# Patient Record
Sex: Female | Born: 1955 | Race: Black or African American | Hispanic: No | Marital: Married | Smoking: Never smoker
Health system: Southern US, Community
[De-identification: ages and names within clinical notes are randomized; demographics above are authoritative.]

## PROBLEM LIST (undated history)

## (undated) ENCOUNTER — Emergency Department (HOSPITAL_COMMUNITY): Admission: EM | Disposition: A | Discharge status: Home / Self Care

## (undated) DIAGNOSIS — N83209 Unspecified ovarian cyst, unspecified side: Secondary | ICD-10-CM

## (undated) DIAGNOSIS — E119 Type 2 diabetes mellitus without complications: Secondary | ICD-10-CM

## (undated) DIAGNOSIS — H3320 Serous retinal detachment, unspecified eye: Secondary | ICD-10-CM

## (undated) DIAGNOSIS — M5430 Sciatica, unspecified side: Secondary | ICD-10-CM

## (undated) DIAGNOSIS — H269 Unspecified cataract: Secondary | ICD-10-CM

## (undated) HISTORY — PX: OVARIAN CYST REMOVAL: SHX89

## (undated) HISTORY — PX: CATARACT EXTRACTION: SUR2

## (undated) HISTORY — PX: EYE SURGERY: SHX253

---

## 1998-06-28 ENCOUNTER — Emergency Department (HOSPITAL_COMMUNITY): Admission: EM | Admit: 1998-06-28 | Discharge: 1998-06-28 | Payer: Self-pay | Admitting: Emergency Medicine

## 1998-07-09 ENCOUNTER — Emergency Department (HOSPITAL_COMMUNITY): Admission: EM | Admit: 1998-07-09 | Discharge: 1998-07-09 | Payer: Self-pay | Admitting: Emergency Medicine

## 1999-09-11 ENCOUNTER — Emergency Department (HOSPITAL_COMMUNITY): Admission: EM | Admit: 1999-09-11 | Discharge: 1999-09-11 | Payer: Self-pay | Admitting: Emergency Medicine

## 2004-01-13 ENCOUNTER — Emergency Department (HOSPITAL_COMMUNITY): Admission: EM | Admit: 2004-01-13 | Discharge: 2004-01-14 | Payer: Self-pay | Admitting: Emergency Medicine

## 2006-01-29 ENCOUNTER — Emergency Department (HOSPITAL_COMMUNITY): Admission: EM | Admit: 2006-01-29 | Discharge: 2006-01-29 | Payer: Self-pay | Admitting: Emergency Medicine

## 2010-07-26 ENCOUNTER — Emergency Department (HOSPITAL_BASED_OUTPATIENT_CLINIC_OR_DEPARTMENT_OTHER)
Admission: EM | Admit: 2010-07-26 | Discharge: 2010-07-26 | Payer: Self-pay | Source: Home / Self Care | Admitting: Emergency Medicine

## 2010-08-06 ENCOUNTER — Emergency Department (HOSPITAL_BASED_OUTPATIENT_CLINIC_OR_DEPARTMENT_OTHER)
Admission: EM | Admit: 2010-08-06 | Discharge: 2010-08-06 | Payer: Self-pay | Source: Home / Self Care | Admitting: Emergency Medicine

## 2010-08-07 ENCOUNTER — Observation Stay (HOSPITAL_COMMUNITY)
Admission: EM | Admit: 2010-08-07 | Discharge: 2010-08-09 | Payer: Self-pay | Source: Home / Self Care | Attending: Internal Medicine | Admitting: Internal Medicine

## 2010-08-08 ENCOUNTER — Encounter (INDEPENDENT_AMBULATORY_CARE_PROVIDER_SITE_OTHER): Payer: Self-pay | Admitting: Internal Medicine

## 2010-08-15 ENCOUNTER — Emergency Department (HOSPITAL_BASED_OUTPATIENT_CLINIC_OR_DEPARTMENT_OTHER)
Admission: EM | Admit: 2010-08-15 | Discharge: 2010-08-15 | Disposition: A | Discharge status: Other Acute Inpatient Hospital | Payer: Self-pay | Source: Home / Self Care | Admitting: Emergency Medicine

## 2010-08-15 ENCOUNTER — Inpatient Hospital Stay (HOSPITAL_COMMUNITY)
Admission: AD | Admit: 2010-08-15 | Discharge: 2010-08-17 | Payer: Self-pay | Source: Home / Self Care | Attending: Internal Medicine | Admitting: Internal Medicine

## 2010-10-28 LAB — LIPID PANEL
Cholesterol: 142 mg/dL (ref 0–200)
LDL Cholesterol: 82 mg/dL (ref 0–99)

## 2010-10-28 LAB — POCT CARDIAC MARKERS
CKMB, poc: 2.7 ng/mL (ref 1.0–8.0)
Myoglobin, poc: 72.8 ng/mL (ref 12–200)
Troponin i, poc: <0.05 ng/mL (ref 0.00–0.09)

## 2010-10-28 LAB — GLUCOSE, CAPILLARY
Glucose-Capillary: 139 mg/dL — ABNORMAL HIGH (ref 70–99)
Glucose-Capillary: 143 mg/dL — ABNORMAL HIGH (ref 70–99)
Glucose-Capillary: 144 mg/dL — ABNORMAL HIGH (ref 70–99)
Glucose-Capillary: 204 mg/dL — ABNORMAL HIGH (ref 70–99)
Glucose-Capillary: 208 mg/dL — ABNORMAL HIGH (ref 70–99)

## 2010-10-28 LAB — BASIC METABOLIC PANEL
BUN: 10 mg/dL (ref 6–23)
BUN: 13 mg/dL (ref 6–23)
BUN: 9 mg/dL (ref 6–23)
CO2: 26 mEq/L (ref 19–32)
CO2: 28 mEq/L (ref 19–32)
CO2: 27 mEq/L (ref 19–32)
Calcium: 9.5 mg/dL (ref 8.4–10.5)
Chloride: 107 mEq/L (ref 96–112)
Chloride: 108 mEq/L (ref 96–112)
Chloride: 109 mEq/L (ref 96–112)
Chloride: 106 mEq/L (ref 96–112)
Creatinine, Ser: 0.63 mg/dL (ref 0.4–1.2)
Creatinine, Ser: 0.92 mg/dL (ref 0.4–1.2)
GFR calc Af Amer: >60 mL/min (ref >60)
GFR calc non Af Amer: >60 mL/min (ref >60)
GFR calc non Af Amer: >60 mL/min (ref >60)
Glucose, Bld: 143 mg/dL — ABNORMAL HIGH (ref 70–99)
Glucose, Bld: 154 mg/dL — ABNORMAL HIGH (ref 70–99)
Glucose, Bld: 166 mg/dL — ABNORMAL HIGH (ref 70–99)
Glucose, Bld: 208 mg/dL — ABNORMAL HIGH (ref 70–99)
Potassium: 3.6 mEq/L (ref 3.5–5.1)
Potassium: 4.1 mEq/L (ref 3.5–5.1)
Potassium: 4.5 mEq/L (ref 3.5–5.1)
Potassium: 4.6 mEq/L (ref 3.5–5.1)
Sodium: 145 mEq/L (ref 135–145)

## 2010-10-28 LAB — CBC
HCT: 34.6 % — ABNORMAL LOW (ref 36.0–46.0)
HCT: 35.5 % — ABNORMAL LOW (ref 36.0–46.0)
Hemoglobin: 11.6 g/dL — ABNORMAL LOW (ref 12.0–15.0)
MCH: 27 pg (ref 26.0–34.0)
MCHC: 32.7 g/dL (ref 30.0–36.0)
MCV: 87.6 fL (ref 78.0–100.0)
MCV: 82.8 fL (ref 78.0–100.0)
Platelets: 220 10*3/uL (ref 150–400)
RBC: 3.95 MIL/uL (ref 3.87–5.11)
WBC: 5.3 10*3/uL (ref 4.0–10.5)

## 2010-10-28 LAB — TSH: TSH: 0.507 u[IU]/mL (ref 0.350–4.500)

## 2010-10-28 LAB — DIFFERENTIAL
Basophils Relative: 0 % (ref 0–1)
Eosinophils Absolute: 0.2 10*3/uL (ref 0.0–0.7)
Eosinophils Relative: 4 % (ref 0–5)
Eosinophils Relative: 4 % (ref 0–5)
Lymphocytes Relative: 20 % (ref 12–46)
Lymphs Abs: 1.1 10*3/uL (ref 0.7–4.0)
Lymphs Abs: 1.1 10*3/uL (ref 0.7–4.0)
Monocytes Absolute: 0.5 10*3/uL (ref 0.1–1.0)
Monocytes Relative: 9 % (ref 3–12)
Neutro Abs: 3.7 10*3/uL (ref 1.7–7.7)
Neutrophils Relative %: 68 % (ref 43–77)

## 2010-10-28 LAB — D-DIMER, QUANTITATIVE: D-Dimer, Quant: 0.77 ug/mL-FEU — ABNORMAL HIGH (ref 0.00–0.48)

## 2010-10-28 LAB — TROPONIN I: Troponin I: 0.02 ng/mL (ref 0.00–0.06)

## 2010-10-28 LAB — MAGNESIUM: Magnesium: 2.1 mg/dL (ref 1.5–2.5)

## 2010-10-28 LAB — T4, FREE: Free T4: 1.07 ng/dL (ref 0.80–1.80)

## 2010-10-28 LAB — CK TOTAL AND CKMB (NOT AT ARMC): Total CK: 364 U/L — ABNORMAL HIGH (ref 7–177)

## 2010-10-28 LAB — HEMOGLOBIN A1C: Mean Plasma Glucose: 134 mg/dL — ABNORMAL HIGH (ref <117)

## 2010-10-28 LAB — BRAIN NATRIURETIC PEPTIDE: Pro B Natriuretic peptide (BNP): 40.4 pg/mL (ref 0.0–100.0)

## 2012-02-03 ENCOUNTER — Encounter (HOSPITAL_BASED_OUTPATIENT_CLINIC_OR_DEPARTMENT_OTHER): Payer: Self-pay | Admitting: *Deleted

## 2012-02-03 ENCOUNTER — Emergency Department (HOSPITAL_BASED_OUTPATIENT_CLINIC_OR_DEPARTMENT_OTHER)
Admission: EM | Admit: 2012-02-03 | Discharge: 2012-02-03 | Disposition: A | Discharge status: Home / Self Care | Payer: Self-pay | Attending: Emergency Medicine | Admitting: Emergency Medicine

## 2012-02-03 DIAGNOSIS — IMO0001 Reserved for inherently not codable concepts without codable children: Secondary | ICD-10-CM | POA: Insufficient documentation

## 2012-02-03 DIAGNOSIS — M5430 Sciatica, unspecified side: Secondary | ICD-10-CM

## 2012-02-03 DIAGNOSIS — M549 Dorsalgia, unspecified: Secondary | ICD-10-CM | POA: Insufficient documentation

## 2012-02-03 MED ORDER — ONDANSETRON HCL 4 MG/2ML IJ SOLN
4.0000 mg | Freq: Once | INTRAMUSCULAR | Status: AC
  Administered 2012-02-03: 4 mg via INTRAMUSCULAR
  Filled 2012-02-03: qty 2

## 2012-02-03 MED ORDER — IBUPROFEN 800 MG PO TABS: 800.0000 mg | ORAL_TABLET | Freq: Three times a day (TID) | ORAL | Status: AC

## 2012-02-03 MED ORDER — KETOROLAC TROMETHAMINE 60 MG/2ML IM SOLN
60.0000 mg | Freq: Once | INTRAMUSCULAR | Status: AC
  Administered 2012-02-03: 60 mg via INTRAMUSCULAR
  Filled 2012-02-03: qty 2

## 2012-02-03 MED ORDER — DIAZEPAM 5 MG PO TABS: 5.0000 mg | ORAL_TABLET | Freq: Two times a day (BID) | ORAL | Status: AC

## 2012-02-03 MED ORDER — HYDROMORPHONE HCL PF 2 MG/ML IJ SOLN
2.0000 mg | Freq: Once | INTRAMUSCULAR | Status: AC
  Administered 2012-02-03: 2 mg via INTRAMUSCULAR
  Filled 2012-02-03: qty 1

## 2012-02-03 NOTE — ED Provider Notes (Signed)
History     CSN: [id number]  Arrival date & time 02/03/12  1318   First MD Initiated Contact with Patient 02/03/12 1403      Chief Complaint  Patient presents with  . Back Pain    (Consider location/radiation/quality/duration/timing/severity/associated sxs/prior treatment) Patient is a 56 y.o. female presenting with back pain. The history is provided by the patient. No language interpreter was used.  Back Pain  This is a new problem. The current episode started yesterday. The problem occurs constantly. The problem has been gradually worsening. The pain is associated with no known injury. The pain radiates to the left thigh. The pain is at a severity of 6/10. The pain is moderate. The symptoms are aggravated by certain positions. The pain is worse during the day. She has tried nothing for the symptoms. The treatment provided moderate relief. Risk factors include obesity.  Pt recenttly diagnosed with sciatica.  Pt reports worse with walking,   Pt reports no relief with mobic  History reviewed. No pertinent past medical history.  Past Surgical History  Procedure Date  . Eye surgery   . Cesarean section     History reviewed. No pertinent family history.  History  Substance Use Topics  . Smoking status: Not on file  . Smokeless tobacco: Not on file  . Alcohol Use: No    OB History    Grav Para Term Preterm Abortions TAB SAB Ect Mult Living                  Review of Systems  Musculoskeletal: Positive for back pain.  All other systems reviewed and are negative.    Allergies  Review of patient's allergies indicates no known allergies.  Home Medications  No current outpatient prescriptions on file.  BP 168/91  Pulse 90  Temp 98.9 F (37.2 C) (Oral)  Ht 5\' 9"  (1.753 m)  Wt 350 lb (158.759 kg)  BMI 51.69 kg/m2  SpO2 100%  Physical Exam  Nursing note and vitals reviewed. Constitutional: She appears well-developed and well-nourished.  HENT:  Head:  Normocephalic.  Right Ear: External ear normal.  Left Ear: External ear normal.  Nose: Nose normal.  Mouth/Throat: Oropharynx is clear and moist.  Eyes: Conjunctivae are normal. Pupils are equal, round, and reactive to light.  Neck: Normal range of motion. Neck supple.  Cardiovascular: Normal rate, regular rhythm and normal heart sounds.   Pulmonary/Chest: Effort normal and breath sounds normal.  Abdominal: Soft. Bowel sounds are normal.  Musculoskeletal: She exhibits tenderness.       Tender left buttock down left leg  Neurological: She is alert.  Skin: Skin is warm.  Psychiatric: She has a normal mood and affect.    ED Course  Procedures (including critical care time)  Labs Reviewed - No data to display No results found.   No diagnosis found.    MDM  Pt given torodol, dilaudid and zofran.    I will treat with valium and ibuprofen.  Pt referred to Dr. Allena Napoleon, [place] 02/03/12 [phone]

## 2012-02-03 NOTE — Discharge Instructions (Signed)

## 2012-02-03 NOTE — ED Provider Notes (Signed)
Medical screening examination/treatment/procedure(s) were performed by non-physician practitioner and as supervising physician I was immediately available for consultation/collaboration.    Samin Milke L Olander Friedl, MD 02/03/12 2258 

## 2012-02-03 NOTE — ED Notes (Signed)
Pt c/o lower left  back pain which radiates into left hip and down left leg x 1 month. Recent dx sciatica

## 2012-10-20 ENCOUNTER — Emergency Department (HOSPITAL_BASED_OUTPATIENT_CLINIC_OR_DEPARTMENT_OTHER): Payer: Self-pay

## 2012-10-20 ENCOUNTER — Emergency Department (HOSPITAL_BASED_OUTPATIENT_CLINIC_OR_DEPARTMENT_OTHER)
Admission: EM | Admit: 2012-10-20 | Discharge: 2012-10-20 | Disposition: A | Discharge status: Home / Self Care | Payer: Self-pay | Attending: Emergency Medicine | Admitting: Emergency Medicine

## 2012-10-20 ENCOUNTER — Encounter (HOSPITAL_BASED_OUTPATIENT_CLINIC_OR_DEPARTMENT_OTHER): Payer: Self-pay

## 2012-10-20 DIAGNOSIS — R030 Elevated blood-pressure reading, without diagnosis of hypertension: Secondary | ICD-10-CM | POA: Insufficient documentation

## 2012-10-20 DIAGNOSIS — Z8742 Personal history of other diseases of the female genital tract: Secondary | ICD-10-CM | POA: Insufficient documentation

## 2012-10-20 DIAGNOSIS — Z8669 Personal history of other diseases of the nervous system and sense organs: Secondary | ICD-10-CM | POA: Insufficient documentation

## 2012-10-20 DIAGNOSIS — M79605 Pain in left leg: Secondary | ICD-10-CM

## 2012-10-20 DIAGNOSIS — M79609 Pain in unspecified limb: Secondary | ICD-10-CM | POA: Insufficient documentation

## 2012-10-20 DIAGNOSIS — G8929 Other chronic pain: Secondary | ICD-10-CM | POA: Insufficient documentation

## 2012-10-20 DIAGNOSIS — R609 Edema, unspecified: Secondary | ICD-10-CM | POA: Insufficient documentation

## 2012-10-20 HISTORY — DX: Serous retinal detachment, unspecified eye: H33.20

## 2012-10-20 HISTORY — DX: Unspecified ovarian cyst, unspecified side: N83.209

## 2012-10-20 HISTORY — DX: Unspecified cataract: H26.9

## 2012-10-20 MED ORDER — HYDROCODONE-ACETAMINOPHEN 5-325 MG PO TABS: 1.0000 | ORAL_TABLET | Freq: Four times a day (QID) | ORAL | Status: DC | PRN

## 2012-10-20 NOTE — ED Notes (Signed)
Pt states she did not get rx valium filled from last ED visit in June-requesting another rx of same-advised she would need to speak with EDP about request

## 2012-10-20 NOTE — ED Provider Notes (Signed)
History     CSN: [id number]  Arrival date & time 10/20/12  1106   First MD Initiated Contact with Patient 10/20/12 1145      Chief Complaint  Patient presents with  . Leg Pain    (Consider location/radiation/quality/duration/timing/severity/associated sxs/prior treatment) Patient is a 57 y.o. female presenting with leg pain. The history is provided by the patient.  Leg Pain Associated symptoms: no back pain and no fever    patient with left leg pain from knee to foot since October no direct injury. Worse in the last week. Patient not really taking any medicine occasionally takes some Advil for it. The pain at worst is 10 out of 10 currently 6/10 does not radiate above the knee. Not involved with any weakness or numbness in the feet.  Past Medical History  Diagnosis Date  . Ovarian cyst   . Retinal detachment   . Cataract     Past Surgical History  Procedure Laterality Date  . Eye surgery    . Cesarean section    . Ovarian cyst removal    . Cataract extraction      No family history on file.  History  Substance Use Topics  . Smoking status: Not on file  . Smokeless tobacco: Not on file  . Alcohol Use: No    OB History   Grav Para Term Preterm Abortions TAB SAB Ect Mult Living                  Review of Systems  Constitutional: Negative for fever.  HENT: Negative for congestion.   Respiratory: Negative for shortness of breath.   Cardiovascular: Negative for chest pain.  Gastrointestinal: Negative for nausea, vomiting and abdominal pain.  Genitourinary: Negative for dysuria.  Musculoskeletal: Negative for back pain.  Skin: Negative for rash.  Neurological: Negative for headaches.  Hematological: Does not bruise/bleed easily.    Allergies  Review of patient's allergies indicates no known allergies.  Home Medications   Current Outpatient Rx  Name  Route  Sig  Dispense  Refill  . ibuprofen (ADVIL,MOTRIN) 600 MG tablet   Oral   Take 600 mg by mouth  every 6 (six) hours as needed for pain.         Marland Kitchen HYDROcodone-acetaminophen (NORCO/VICODIN) 5-325 MG per tablet   Oral   Take 1-2 tablets by mouth every 6 (six) hours as needed for pain.   20 tablet   0     BP 176/75  Pulse 84  Temp(Src) 98.1 F (36.7 C) (Oral)  Resp 18  Ht 5\' 9"  (1.753 m)  Wt 350 lb (158.759 kg)  BMI 51.66 kg/m2  SpO2 100%  Physical Exam  Nursing note and vitals reviewed. Constitutional: She is oriented to person, place, and time. She appears well-developed and well-nourished.  HENT:  Head: Normocephalic and atraumatic.  Eyes: Conjunctivae are normal. Pupils are equal, round, and reactive to light.  Neck: Normal range of motion.  Pulmonary/Chest: Effort normal and breath sounds normal.  Abdominal: Soft. Bowel sounds are normal. There is no tenderness.  Musculoskeletal: Normal range of motion. She exhibits edema. She exhibits no tenderness.  Bilateral lower extremity swelling. Left not worse than right. Dorsalis pedis pulses 1+ in both feet. Good range of motion. No swelling of the knee or ankle no deformity. No erythema nontender to palpation.  Neurological: She is alert and oriented to person, place, and time. No cranial nerve deficit. She exhibits normal muscle tone. Coordination normal.  Skin: No  rash noted. No erythema.    ED Course  Procedures (including critical care time)  Labs Reviewed - No data to display US Venous Img Lower Unilateral Left  10/20/2012  *RADIOLOGY REPORT*  Clinical Data: Chronic left calf pain  LEFT LOWER EXTREMITY VENOUS DUPLEX ULTRASOUND  Technique:  Gray-scale sonography with graded compression, as well as color Doppler and duplex ultrasound, were performed to evaluate the deep venous system of the lower extremity from the level of the common femoral vein through the popliteal and proximal calf veins. Spectral Doppler was utilized to evaluate flow at rest and with distal augmentation maneuvers.  Comparison:  None.  Findings: The  visualized left lower extremity deep venous system appears patent.  Normal compressibility.  Patent color Doppler flow.  Satisfactory spectral Doppler with respiratory variation and response to augmentation.  The greater saphenous vein, where visualized, is patent and compressible.  Subcutaneous edema in the calf.  IMPRESSION: No deep venous thrombosis in the visualized left lower extremity.  Subcutaneous edema in the calf.   Original Report Authenticated By: Charline Bills, M.D.      1. Leg pain, left       MDM  Ultrasound study of the left leg shows no evidence of deep vein thrombosis. Clinically there is no evidence of cellulitis deformity or any concern for bony injury or sprain. Patient's pain is from the knee down. That pain has been present since October so it's chronic in nature. We'll treat with pain medication to patient the resource guide for referral. Here today put pressure was elevated but difficult to assess that long-term based on the fact that she was in pain today but if it continues she'll need the treatment for hypertension. Patient in no acute distress in ED nontoxic.        Shelda Jakes, MD 10/20/12 [phone]

## 2012-10-20 NOTE — ED Notes (Signed)
C/o pain to left lower leg since Oct-denies injury-states pain started after she had to sit up in chair post eye surgery

## 2012-11-24 ENCOUNTER — Emergency Department (HOSPITAL_COMMUNITY)
Admission: EM | Admit: 2012-11-24 | Discharge: 2012-11-24 | Disposition: A | Discharge status: Home / Self Care | Payer: Self-pay | Attending: Emergency Medicine | Admitting: Emergency Medicine

## 2012-11-24 ENCOUNTER — Encounter (HOSPITAL_COMMUNITY): Payer: Self-pay | Admitting: Emergency Medicine

## 2012-11-24 DIAGNOSIS — E669 Obesity, unspecified: Secondary | ICD-10-CM | POA: Insufficient documentation

## 2012-11-24 DIAGNOSIS — Z8669 Personal history of other diseases of the nervous system and sense organs: Secondary | ICD-10-CM | POA: Insufficient documentation

## 2012-11-24 DIAGNOSIS — Z8742 Personal history of other diseases of the female genital tract: Secondary | ICD-10-CM | POA: Insufficient documentation

## 2012-11-24 DIAGNOSIS — R609 Edema, unspecified: Secondary | ICD-10-CM | POA: Insufficient documentation

## 2012-11-24 DIAGNOSIS — M79609 Pain in unspecified limb: Secondary | ICD-10-CM | POA: Insufficient documentation

## 2012-11-24 DIAGNOSIS — M79605 Pain in left leg: Secondary | ICD-10-CM

## 2012-11-24 MED ORDER — TRAMADOL HCL 50 MG PO TABS: 50.0000 mg | ORAL_TABLET | Freq: Four times a day (QID) | ORAL | Status: DC | PRN

## 2012-11-24 NOTE — ED Notes (Signed)
Pt states for the past few weeks now her left leg has been hurting her behind the knee and leg. Denies any injury states that she has tried muscle creams and it has not got any better. Constant pain.

## 2012-11-24 NOTE — Progress Notes (Signed)
*  Preliminary Results* Left lower extremity venous duplex completed. Left lower extremity is negative for deep vein thrombosis. No evidence of left Baker's cyst. Preliminary results discussed with Riki Rusk, RN.  11/24/2012 2:12 PM Gertie Fey, RDMS, RDCS

## 2012-11-24 NOTE — ED Provider Notes (Signed)
History     CSN: [id number]  Arrival date & time 11/24/12  1121   First MD Initiated Contact with Patient 11/24/12 1230      Chief Complaint  Patient presents with  . Leg Pain    HPI  Patient presents with concern of ongoing left leg pain.  The pain is focally about the knee, posteriorly.  The pain is worse with motion and weightbearing.  She denies any fall, trauma. Onset was greater than one month ago, so the pain has become worse over the past few weeks, in spite of home medications use.  She is not clear what medication she has tried. She continues to ambulate, perform activities of daily living without significant impairment. She is not immobilized, does not smoke, does not take hormone supplement.  She states that the left leg is grossly larger than the right. She is been seen here once before for this pain.   Past Medical History  Diagnosis Date  . Ovarian cyst   . Retinal detachment   . Cataract     Past Surgical History  Procedure Laterality Date  . Eye surgery    . Cesarean section    . Ovarian cyst removal    . Cataract extraction      No family history on file.  History  Substance Use Topics  . Smoking status: Not on file  . Smokeless tobacco: Not on file  . Alcohol Use: No    OB History   Grav Para Term Preterm Abortions TAB SAB Ect Mult Living                  Review of Systems  Constitutional: Negative for fever.  HENT: Negative for congestion.   Respiratory: Negative for shortness of breath.   Cardiovascular: Negative for chest pain.  Gastrointestinal: Negative for nausea, vomiting and abdominal pain.  Genitourinary: Negative for dysuria.  Musculoskeletal: Negative for back pain.  Skin: Negative for rash.  Neurological: Negative for headaches.  Hematological: Does not bruise/bleed easily.    Allergies  Review of patient's allergies indicates no known allergies.  Home Medications   Current Outpatient Rx  Name  Route  Sig  Dispense   Refill  . traMADol (ULTRAM) 50 MG tablet   Oral   Take 1 tablet (50 mg total) by mouth every 6 (six) hours as needed for pain.   15 tablet   0     BP 190/84  Pulse 75  Temp(Src) 98 F (36.7 C) (Oral)  Resp 15  SpO2 98%  Physical Exam  Nursing note and vitals reviewed. Constitutional: She is oriented to person, place, and time. She appears well-developed and well-nourished.  HENT:  Head: Normocephalic and atraumatic.  Eyes: Conjunctivae are normal. Pupils are equal, round, and reactive to light.  Neck: Normal range of motion.  Pulmonary/Chest: Effort normal and breath sounds normal.  Abdominal: Soft. Bowel sounds are normal. There is no tenderness.  Musculoskeletal: Normal range of motion. She exhibits edema. She exhibits no tenderness.  Bilateral lower extremity swelling.  There is appreciable difference with left lower extremity larger than the right.  There is no tenderness to palpation throughout the knee, ankle, leg, the patient indicates the pain is deep in the posterior popliteal space  Neurological: She is alert and oriented to person, place, and time. No cranial nerve deficit. She exhibits normal muscle tone. Coordination normal.  Skin: No rash noted. No erythema.    ED Course  Procedures (including critical care time)  Labs Reviewed - No data to display No results found.   1. Leg pain, lateral, left     On repeat exam, when the patient was informed of the results, she appears in no distress.  MDM  The patient presents for second time in several months with ongoing left leg pain.  The patient is in no distress, the hypoxia, tachycardia, tachypnea, or other systemic complaints.  Given the patient's obesity, there is some suspicion for this etiology of her pain, though with the asymmetry of size, she had ultrasound to rule out DVT.  No evidence of PE.  The patient was discharged in stable condition with orthopedics followup.     Gerhard Munch, MD 11/24/12  1525

## 2013-04-21 ENCOUNTER — Emergency Department (HOSPITAL_BASED_OUTPATIENT_CLINIC_OR_DEPARTMENT_OTHER)
Admission: EM | Admit: 2013-04-21 | Discharge: 2013-04-21 | Disposition: A | Discharge status: Home / Self Care | Payer: Medicaid Other | Attending: Emergency Medicine | Admitting: Emergency Medicine

## 2013-04-21 ENCOUNTER — Encounter (HOSPITAL_BASED_OUTPATIENT_CLINIC_OR_DEPARTMENT_OTHER): Payer: Self-pay | Admitting: Emergency Medicine

## 2013-04-21 DIAGNOSIS — Z8742 Personal history of other diseases of the female genital tract: Secondary | ICD-10-CM | POA: Insufficient documentation

## 2013-04-21 DIAGNOSIS — Z8669 Personal history of other diseases of the nervous system and sense organs: Secondary | ICD-10-CM | POA: Insufficient documentation

## 2013-04-21 DIAGNOSIS — M79609 Pain in unspecified limb: Secondary | ICD-10-CM | POA: Insufficient documentation

## 2013-04-21 DIAGNOSIS — M5432 Sciatica, left side: Secondary | ICD-10-CM

## 2013-04-21 DIAGNOSIS — M5430 Sciatica, unspecified side: Secondary | ICD-10-CM | POA: Insufficient documentation

## 2013-04-21 DIAGNOSIS — E119 Type 2 diabetes mellitus without complications: Secondary | ICD-10-CM | POA: Insufficient documentation

## 2013-04-21 HISTORY — DX: Type 2 diabetes mellitus without complications: E11.9

## 2013-04-21 MED ORDER — ONDANSETRON HCL 4 MG/2ML IJ SOLN
4.0000 mg | Freq: Once | INTRAMUSCULAR | Status: AC
  Administered 2013-04-21: 4 mg via INTRAMUSCULAR
  Filled 2013-04-21: qty 2

## 2013-04-21 MED ORDER — HYDROMORPHONE HCL PF 1 MG/ML IJ SOLN
1.0000 mg | Freq: Once | INTRAMUSCULAR | Status: AC
  Administered 2013-04-21: 1 mg via INTRAMUSCULAR
  Filled 2013-04-21: qty 1

## 2013-04-21 MED ORDER — HYDROCODONE-ACETAMINOPHEN 5-325 MG PO TABS: 2.0000 | ORAL_TABLET | ORAL | Status: DC | PRN

## 2013-04-21 MED ORDER — DEXAMETHASONE SODIUM PHOSPHATE 10 MG/ML IJ SOLN
10.0000 mg | Freq: Once | INTRAMUSCULAR | Status: AC
  Administered 2013-04-21: 10 mg via INTRAMUSCULAR
  Filled 2013-04-21: qty 1

## 2013-04-21 NOTE — ED Provider Notes (Signed)
CSN: [id number]     Arrival date & time 04/21/13  1538 History   First MD Initiated Contact with Patient 04/21/13 1554     Chief Complaint  Patient presents with  . Leg Pain  . Back Pain   (Consider location/radiation/quality/duration/timing/severity/associated sxs/prior Treatment) HPI Comments: Patient presents with a one-month history of worsening pain to her left takes area that radiates down her left leg. She denies any numbness or weakness in the leg. She states it's worse when she sits down on her buttocks. She does have a history of sciatica and says it is still similar. She denies any injuries to her back or her leg. She's been taking over-the-counter medicines without relief. She denies abdominal pain. She denies any loss of bowel or bladder control.   Past Medical History  Diagnosis Date  . Ovarian cyst   . Retinal detachment   . Cataract   . Diabetes mellitus without complication    Past Surgical History  Procedure Laterality Date  . Eye surgery    . Cesarean section    . Ovarian cyst removal    . Cataract extraction     No family history on file. History  Substance Use Topics  . Smoking status: Never Smoker   . Smokeless tobacco: Not on file  . Alcohol Use: No   OB History   Grav Para Term Preterm Abortions TAB SAB Ect Mult Living                 Review of Systems  Constitutional: Negative for fever, chills, diaphoresis and fatigue.  HENT: Negative for congestion, rhinorrhea and sneezing.   Eyes: Negative.   Respiratory: Negative for cough, chest tightness and shortness of breath.   Cardiovascular: Negative for chest pain and leg swelling.  Gastrointestinal: Negative for nausea, vomiting, abdominal pain, diarrhea and blood in stool.  Genitourinary: Negative for frequency, hematuria, flank pain and difficulty urinating.  Musculoskeletal: Positive for back pain. Negative for arthralgias.  Skin: Negative for rash.  Neurological: Negative for dizziness, speech  difficulty, weakness, numbness and headaches.    Allergies  Review of patient's allergies indicates no known allergies.  Home Medications   Current Outpatient Rx  Name  Route  Sig  Dispense  Refill  . HYDROcodone-acetaminophen (NORCO/VICODIN) 5-325 MG per tablet   Oral   Take 2 tablets by mouth every 4 (four) hours as needed for pain.   15 tablet   0   . traMADol (ULTRAM) 50 MG tablet   Oral   Take 1 tablet (50 mg total) by mouth every 6 (six) hours as needed for pain.   15 tablet   0    BP 181/78  Pulse 88  Temp(Src) 98.8 F (37.1 C) (Oral)  Resp 20  Ht 5\' 9"  (1.753 m)  Wt 300 lb (136.079 kg)  BMI 44.28 kg/m2 Physical Exam  Constitutional: She is oriented to person, place, and time. She appears well-developed and well-nourished.  HENT:  Head: Normocephalic and atraumatic.  Eyes: Pupils are equal, round, and reactive to light.  Neck: Normal range of motion. Neck supple.  Cardiovascular: Normal rate, regular rhythm and normal heart sounds.   Pulmonary/Chest: Effort normal and breath sounds normal. No respiratory distress. She has no wheezes. She has no rales. She exhibits no tenderness.  Abdominal: Soft. Bowel sounds are normal. There is no tenderness. There is no rebound and no guarding.  Musculoskeletal: Normal range of motion. She exhibits no edema.  There is tenderness over the left  sciatic nerve. There's also tenderness in the left lower paraspinal muscles in the lumbar region. Negative straight leg raise bilaterally. She has normal sensation in the feet bilaterally. She has normal motor function in the legs. Pedal pulses are symmetric.  Lymphadenopathy:    She has no cervical adenopathy.  Neurological: She is alert and oriented to person, place, and time.  Skin: Skin is warm and dry. No rash noted.  Psychiatric: She has a normal mood and affect.    ED Course  Procedures (including critical care time) Labs Review Labs Reviewed - No data to display Imaging  Review No results found.  MDM   1. Sciatica, left    Patient symptoms consistent with sciatica. There's no neurologic deficits or signs of cauda equina. She was given a shot of Dilaudid, Decadron and Zofran here in the ED. She was also given a prescription for Vicodin to use at home. She was given referral to follow up with a [facility]. She will need to have her blood pressure rechecked by primary care physician    Rolan Bucco, MD 04/21/13 [phone]

## 2013-04-21 NOTE — ED Notes (Signed)
Left lower back pain radiating down left leg x1 month.  Reports hx of sciatica.

## 2013-04-21 NOTE — ED Notes (Addendum)
Left low back pain and left leg pain x1 month.  Hx of sciatica.  Pt also reports that she is diabetic and has not had any DM meds for a "couple months" since she can't afford it.

## 2013-07-11 ENCOUNTER — Ambulatory Visit: Payer: Medicaid Other | Admitting: Obstetrics

## 2013-09-01 ENCOUNTER — Emergency Department (HOSPITAL_BASED_OUTPATIENT_CLINIC_OR_DEPARTMENT_OTHER): Payer: Medicaid Other

## 2013-09-01 ENCOUNTER — Encounter (HOSPITAL_BASED_OUTPATIENT_CLINIC_OR_DEPARTMENT_OTHER): Payer: Self-pay | Admitting: Emergency Medicine

## 2013-09-01 ENCOUNTER — Emergency Department (HOSPITAL_BASED_OUTPATIENT_CLINIC_OR_DEPARTMENT_OTHER)
Admission: EM | Admit: 2013-09-01 | Discharge: 2013-09-01 | Disposition: A | Discharge status: Home / Self Care | Payer: Medicaid Other | Attending: Emergency Medicine | Admitting: Emergency Medicine

## 2013-09-01 ENCOUNTER — Telehealth: Payer: Self-pay | Admitting: General Practice

## 2013-09-01 DIAGNOSIS — E119 Type 2 diabetes mellitus without complications: Secondary | ICD-10-CM | POA: Insufficient documentation

## 2013-09-01 DIAGNOSIS — Z8742 Personal history of other diseases of the female genital tract: Secondary | ICD-10-CM | POA: Insufficient documentation

## 2013-09-01 DIAGNOSIS — R05 Cough: Secondary | ICD-10-CM

## 2013-09-01 DIAGNOSIS — Z8669 Personal history of other diseases of the nervous system and sense organs: Secondary | ICD-10-CM | POA: Insufficient documentation

## 2013-09-01 DIAGNOSIS — N390 Urinary tract infection, site not specified: Secondary | ICD-10-CM | POA: Insufficient documentation

## 2013-09-01 DIAGNOSIS — Z79899 Other long term (current) drug therapy: Secondary | ICD-10-CM | POA: Insufficient documentation

## 2013-09-01 DIAGNOSIS — R059 Cough, unspecified: Secondary | ICD-10-CM | POA: Insufficient documentation

## 2013-09-01 LAB — URINALYSIS, ROUTINE W REFLEX MICROSCOPIC
Bilirubin Urine: NEGATIVE
GLUCOSE, UA: NEGATIVE mg/dL
Hgb urine dipstick: NEGATIVE
KETONES UR: NEGATIVE mg/dL
Nitrite: NEGATIVE
PH: 7 (ref 5.0–8.0)
Protein, ur: NEGATIVE mg/dL
Specific Gravity, Urine: 1.018 (ref 1.005–1.030)
Urobilinogen, UA: 1 mg/dL (ref 0.0–1.0)

## 2013-09-01 LAB — URINE MICROSCOPIC-ADD ON

## 2013-09-01 MED ORDER — CEPHALEXIN 500 MG PO CAPS: 500.0000 mg | ORAL_CAPSULE | Freq: Four times a day (QID) | ORAL | Status: DC

## 2013-09-01 NOTE — ED Notes (Signed)
Pt reports a productive cough x 1 month and urinary frequency x 1 week unrelieved after taking OTC medications.

## 2013-09-01 NOTE — Telephone Encounter (Signed)
Returning pt's call, not able to leave message.

## 2013-09-01 NOTE — Discharge Instructions (Signed)
Urinary Tract Infection Urinary tract infections (UTIs) can develop anywhere along your urinary tract. Your urinary tract is your body's drainage system for removing wastes and extra water. Your urinary tract includes two kidneys, two ureters, a bladder, and a urethra. Your kidneys are a pair of bean-shaped organs. Each kidney is about the size of your fist. They are located below your ribs, one on each side of your spine. CAUSES Infections are caused by microbes, which are microscopic organisms, including fungi, viruses, and bacteria. These organisms are so small that they can only be seen through a microscope. Bacteria are the microbes that most commonly cause UTIs. SYMPTOMS  Symptoms of UTIs may vary by age and gender of the patient and by the location of the infection. Symptoms in young women typically include a frequent and intense urge to urinate and a painful, burning feeling in the bladder or urethra during urination. Older women and men are more likely to be tired, shaky, and weak and have muscle aches and abdominal pain. A fever may mean the infection is in your kidneys. Other symptoms of a kidney infection include pain in your back or sides below the ribs, nausea, and vomiting. DIAGNOSIS To diagnose a UTI, your caregiver will ask you about your symptoms. Your caregiver also will ask to provide a urine sample. The urine sample will be tested for bacteria and white blood cells. White blood cells are made by your body to help fight infection. TREATMENT  Typically, UTIs can be treated with medication. Because most UTIs are caused by a bacterial infection, they usually can be treated with the use of antibiotics. The choice of antibiotic and length of treatment depend on your symptoms and the type of bacteria causing your infection. HOME CARE INSTRUCTIONS  If you were prescribed antibiotics, take them exactly as your caregiver instructs you. Finish the medication even if you feel better after you  have only taken some of the medication.  Drink enough water and fluids to keep your urine clear or pale yellow.  Avoid caffeine, tea, and carbonated beverages. They tend to irritate your bladder.  Empty your bladder often. Avoid holding urine for long periods of time.  Empty your bladder before and after sexual intercourse.  After a bowel movement, women should cleanse from front to back. Use each tissue only once. SEEK MEDICAL CARE IF:   You have back pain.  You develop a fever.  Your symptoms do not begin to resolve within 3 days. SEEK IMMEDIATE MEDICAL CARE IF:   You have severe back pain or lower abdominal pain.  You develop chills.  You have nausea or vomiting.  You have continued burning or discomfort with urination. MAKE SURE YOU:   Understand these instructions.  Will watch your condition.  Will get help right away if you are not doing well or get worse. Document Released: 05/14/2005 Document Revised: 02/03/2012 Document Reviewed: 09/12/2011 [facility] Patient Information 2014 [place].  Cough, Adult  A cough is a reflex that helps clear your throat and airways. It can help heal the body or may be a reaction to an irritated airway. A cough may only last 2 or 3 weeks (acute) or may last more than 8 weeks (chronic).  CAUSES Acute cough:  Viral or bacterial infections. Chronic cough:  Infections.  Allergies.  Asthma.  Post-nasal drip.  Smoking.  Heartburn or acid reflux.  Some medicines.  Chronic lung problems (COPD).  Cancer. SYMPTOMS   Cough.  Fever.  Chest pain.  Increased breathing rate.  High-pitched whistling sound when breathing (wheezing).  Colored mucus that you cough up (sputum). TREATMENT   A bacterial cough may be treated with antibiotic medicine.  A viral cough must run its course and will not respond to antibiotics.  Your caregiver may recommend other treatments if you have a chronic cough. HOME CARE  INSTRUCTIONS   Only take over-the-counter or prescription medicines for pain, discomfort, or fever as directed by your caregiver. Use cough suppressants only as directed by your caregiver.  Use a cold steam vaporizer or humidifier in your bedroom or home to help loosen secretions.  Sleep in a semi-upright position if your cough is worse at night.  Rest as needed.  Stop smoking if you smoke. SEEK IMMEDIATE MEDICAL CARE IF:   You have pus in your sputum.  Your cough starts to worsen.  You cannot control your cough with suppressants and are losing sleep.  You begin coughing up blood.  You have difficulty breathing.  You develop pain which is getting worse or is uncontrolled with medicine.  You have a fever. MAKE SURE YOU:   Understand these instructions.  Will watch your condition.  Will get help right away if you are not doing well or get worse. Document Released: 01/31/2011 Document Revised: 10/27/2011 Document Reviewed: 01/31/2011 [facility] Patient Information 2014 [place].    Emergency Department Resource Guide 1) Find a Doctor and Pay Out of Pocket Although you won't have to find out who is covered by your insurance plan, it is a good idea to ask around and get recommendations. You will then need to call the office and see if the doctor you have chosen will accept you as a new patient and what types of options they offer for patients who are self-pay. Some doctors offer discounts or will set up payment plans for their patients who do not have insurance, but you will need to ask so you aren't surprised when you get to your appointment.  2) Contact Your Local Health Department Not all health departments have doctors that can see patients for sick visits, but many do, so it is worth a call to see if yours does. If you don't know where your local health department is, you can check in your phone book. The CDC also has a tool to help you locate your state's health  department, and many state websites also have listings of all of their local health departments.  3) Find a [facility] If your illness is not likely to be very severe or complicated, you may want to try a walk in clinic. These are popping up all over the country in pharmacies, drugstores, and shopping centers. They're usually staffed by nurse practitioners or physician assistants that have been trained to treat common illnesses and complaints. They're usually fairly quick and inexpensive. However, if you have serious medical issues or chronic medical problems, these are probably not your best option.  No Primary Care Doctor: - Call Health Connect at  [phone] - they can help you locate a primary care doctor that  accepts your insurance, provides certain services, etc. - Physician Referral Service- [phone]  Chronic Pain Problems: Organization         Address  Phone   Notes  [facility]  [phone] Patients need to be referred by their primary care doctor.   Medication Assistance: Organization         Address  Phone   Notes  [place]  [facility] Medication Assistance Program [address] [place], [place] [address] [phone] --Must be a resident of [facility] -- Must have NO insurance coverage whatsoever (no Medicaid/ Medicare, etc.) -- The pt. MUST have a primary care doctor that directs their care regularly and follows them in the community   MedAssist  [phone]   [employer]  [phone]    Agencies that provide inexpensive medical care: Organization         Address  Phone   Notes  [place]  [phone]   [facility]    [phone]   [facility], [place] [address] [phone]   [place] [address], [place] [phone]   Planned Parenthood    [phone]   [facility]    [phone]    [place] and [place] [address], [place] Phone:  [phone], Fax:  [phone] Hours of Operation:  9 am - 6 pm, M-F.  Also accepts Medicaid/Medicare and self-pay.  [facility] for [place] [address], [address], [place] Phone: [phone], Fax: [phone]. Hours of Operation:  8:30 am - 5:30 pm, M-F.  Also accepts Medicaid and self-pay.  [facility] [place] [address], [place] Phone: [phone]   [place], [place], [place] [phone], Ext. [phone] Mondays & Thursdays: 7-9 AM.  First 15 patients are seen on a first come, first serve basis.    [place] Providers:  Organization         Address  Phone   Notes  [facility] [address], [place] [phone] Also accepts self-pay patients.  [facility] [id number] [place], [place]  [phone]   [place], [address], [place] [phone]   [facility] [address] [place] [phone]   Lucianne Lei [address], [place]   [phone] Only accepts [place] Access [place] patients after they have their name applied to their card.   Self-Pay (no insurance) in [facility]:  Organization         Address  Phone   Notes  Sickle Cell Patients, [facility] [place] [phone]   [facility] [place] [phone]   [facility] [place]  [place], [place], [place] [phone]   [facility]/Dr. Osei-Bonsu  [address] [place] or [address], [place] [phone] Phone number for both [place] and [place] locations is the same.  [facility] [address], [place] [phone]    [facility] [address], [place] or [address] [phone]   [facility] [address] [place] [phone], phone; [phone], fax Sees patients 1st and 3rd Saturday of every month.  Must not qualify for public or private insurance (i.e. Medicaid, Medicare,  Health Choice, Veterans' Benefits)  Household income should be no more than 200% of the poverty level The clinic cannot treat you if you are pregnant or think you are pregnant  Sexually transmitted diseases are not  treated at the clinic.    Dental Care: Organization         Address  Phone  Notes  [facility] Department of [facility] [phone] Accepts children up to age 49 who are enrolled in [place] or [place]; pregnant women with a Medicaid card; and children who have applied for Medicaid or [place] Health Choice, but were declined, whose parents can pay a reduced fee at time of service.  [facility] Department of [facility]  [address], [place] [phone] Accepts children up to age 63 who are enrolled in [place] or [place]; pregnant women with a Medicaid card; and children who have applied for Medicaid or [place] Health Choice, but were declined, whose parents can pay a reduced fee at time of service.  [place] Adult Dental Access PROGRAM  [place] [phone] Patients are seen by appointment only. Walk-ins are not accepted. [place] will see patients 92 years of age and older. Monday - Tuesday (8am-5pm) Most Wednesdays (8:30-5pm) $30 per visit, cash only  [facility] Adult Dental Access PROGRAM  [address], [facility] [phone] Patients are seen by appointment only. Walk-ins are not accepted. [place] will see patients 24 years of age and older. One Wednesday Evening (Monthly: Volunteer Based).   $30 per visit, cash only  [place]  [phone] for adults; Children under age 20, call Graduate Pediatric Dentistry at [phone]. Children aged 62-14, please call [phone] to request a pediatric application.  Dental services are provided in all areas of dental care including fillings, crowns and bridges, complete and partial dentures, implants, gum treatment, root canals, and extractions. Preventive care is also provided. Treatment is provided to both adults and children. Patients are selected via a lottery and there is often a waiting list.   [facility] [address], [place]  [phone] [email or web address]   [facility] [address] [place], [place] [phone], Ext. [phone] Second and Fourth Thursday of each month, opens at 6:30 AM; Clinic ends at 9 AM.  Patients are seen on a first-come first-served basis, and a limited number are seen during each clinic.   [facility]  [address] Hillard Danker [place], [place] [phone]   Eligibility Requirements You must have lived in [place], [place], or [place] counties for at least the last three months.   You cannot be eligible for state or federal sponsored [employer], including [employer], [place], or [employer].   You generally cannot be eligible for healthcare insurance through your employer.    How to apply: Eligibility screenings are held every Tuesday and Wednesday afternoon from 1:00 pm until 4:00 pm. You do not need an appointment for the interview!  [facility] [address], [place], [place]   [place]  [place] Department  [place]  [phone]    Behavioral Health Resources in the Community: Intensive Outpatient Programs Organization         Address  Phone  Notes  [place] Arnold. [address], [place], [place] [phone]   [facility] Outpatient [address] [place], [place]   ADS: [facility] Drug S[facility] [address], [place], [place]   [place]  [place] [address]  [place], [place] or [phone]   Substance Abuse Resources Organization         Address  Phone  Notes  [facility] Drug Services  [phone]   [place]  [phone]   [facility] [place]   Chinita Pester  [phone]   Residential & Outpatient Substance Abuse Program  [phone]   Psychological Services Organization         Address  Phone  Notes  [facility] [place]  [place]  [phone]   [place] [address] [place] or [phone]    Mobile Crisis Teams Organization         Address  Phone  Notes  [facility], Mobile Crisis Care Unit  [phone]   [facility]  [address] [place], [place]   [place] [address] [place] [phone]    Self-Help/Support Groups Organization         Address  Phone             Notes  [place]. of [place] - variety of support groups  [place] Call for more information  Narcotics Anonymous (NA), [facility] [address], [employer] [place]  2 meetings at this location   Special educational needs teacher         Address  Phone  Notes  [facility] [place],    [place]  [phone]   [facility]  [address], [place] [id number], [place], [place]   [place], [place] [phone] Admissions: 8am-3pm M-F  Incentives Substance [place] [address]    [place], [place] [phone]   [facility] [address] [place], [place], [place]   [facility] [address]  [place], [place]   [facility] - Intensive Outpatient [address] Kristeen [address], [place], [place]   [facility] ([place].,  [place], [place] [phone] or [phone]   [facility] (RTS) [address], [place], [place] Accepts Medicaid  [facility] [place] [address],  [place] [phone] Substance Abuse/Addiction Treatment   [facility] Organization         Address  Phone  Notes  [facility]  [phone]   Domenic Schwab, PhD [address] Arlis Porta [place], [place]   [phone] or [phone]   [place], [place] [phone]   [facility] [address], [place], [place] [phone] Insurance/Medicaid/sponsorship through [facility] and Families [address]                                    [place], [place] [phone] [place] [address] [place], [place] [phone]    Dr. Adele Schilder  [phone]   [facility] Dept. 1) [address],  2) [place] 3)  [place] [address], [place] [phone]  [phone]   [place] [phone] or [phone] (After Hours)

## 2013-09-01 NOTE — ED Provider Notes (Signed)
TIME SEEN: 10:15 AM  CHIEF COMPLAINT: Cough, urinary frequency and strong odor  HPI: Patient is a 58 year old female with a history of non-insulin-dependent diabetes who presents emergency department with productive cough with white sputum for the past month and urinary frequency and strong urinary odor for the past week. She denies that she's had any fevers, chills, chest pain or shortness of breath, nausea, vomiting, diarrhea, abdominal pain, vaginal discharge or bleeding. She has not seen a primary care physician for her symptoms. She reports she has had bronchitis in the past and UTIs in she states the symptoms feel similar. No sick contacts or recent travel.  ROS: See HPI Constitutional: no fever  Eyes: no drainage  ENT: no runny nose   Cardiovascular:  no chest pain  Resp: no SOB  GI: no vomiting GU: no dysuria Integumentary: no rash  Allergy: no hives  Musculoskeletal: no leg swelling  Neurological: no slurred speech ROS otherwise negative  PAST MEDICAL HISTORY/PAST SURGICAL HISTORY:  Past Medical History  Diagnosis Date  . Ovarian cyst   . Retinal detachment   . Cataract   . Diabetes mellitus without complication     MEDICATIONS:  Prior to Admission medications   Medication Sig Start Date End Date Taking? Authorizing Provider  metFORMIN (GLUCOPHAGE) 500 MG tablet Take 500 mg by mouth 2 (two) times daily with a meal.   Yes Historical Provider, MD  HYDROcodone-acetaminophen (NORCO/VICODIN) 5-325 MG per tablet Take 2 tablets by mouth every 4 (four) hours as needed for pain. 04/21/13   Malvin Johns, MD  traMADol (ULTRAM) 50 MG tablet Take 1 tablet (50 mg total) by mouth every 6 (six) hours as needed for pain. 11/24/12   Carmin Muskrat, MD    ALLERGIES:  No Known Allergies  SOCIAL HISTORY:  History  Substance Use Topics  . Smoking status: Never Smoker   . Smokeless tobacco: Not on file  . Alcohol Use: No    FAMILY HISTORY: No family history on file.  EXAM: BP  159/100  Pulse 96  Temp(Src) 97.7 F (36.5 C) (Oral)  Resp 14  SpO2 100% CONSTITUTIONAL: Alert and oriented and responds appropriately to questions. Well-appearing; well-nourished HEAD: Normocephalic EYES: Conjunctivae clear, PERRL ENT: normal nose; no rhinorrhea; moist mucous membranes; pharynx without lesions noted NECK: Supple, no meningismus, no LAD  CARD: RRR; S1 and S2 appreciated; no murmurs, no clicks, no rubs, no gallops RESP: Normal chest excursion without splinting or tachypnea; breath sounds clear and equal bilaterally; no wheezes, no rhonchi, no rales,  ABD/GI: Normal bowel sounds; non-distended; soft, non-tender, no rebound, no guarding BACK:  The back appears normal and is non-tender to palpation, there is no CVA tenderness EXT: Normal ROM in all joints; non-tender to palpation; no edema; normal capillary refill; no cyanosis    SKIN: Normal color for age and race; warm NEURO: Moves all extremities equally PSYCH: The patient's mood and manner are appropriate. Grooming and personal hygiene are appropriate.  MEDICAL DECISION MAKING: Patient here with cough for one month and urinary frequency and stronger for one week. Her chest x-ray shows no obvious pneumonia. She does have leukocytes and bacteria in her urine. Culture pending. We'll discharge home with prescription for Keflex. Will give PCP followup information.       [place], DO 09/01/13 1045

## 2013-09-03 ENCOUNTER — Telehealth (HOSPITAL_COMMUNITY): Payer: Self-pay | Admitting: Emergency Medicine

## 2013-09-03 LAB — URINE CULTURE: Colony Count: >=100000

## 2013-09-03 NOTE — ED Notes (Signed)
Post ED Visit - Positive Culture Follow-up  Culture report reviewed by antimicrobial stewardship pharmacist: []  Wes Dulaney, Pharm.D., BCPS []  Heide Guile, Pharm.D., BCPS []  Alycia Rossetti, Pharm.D., BCPS [x]  [place], [place].D., BCPS, AAHIVP []  Legrand [place], Pharm.D., BCPS, AAHIVP  Positive urine culture Treated with Keflex, organism sensitive to the same and no further patient follow-up is required at this time.  [place], Rex Kras 09/03/2013, 2:32 PM

## 2013-09-12 ENCOUNTER — Encounter (HOSPITAL_BASED_OUTPATIENT_CLINIC_OR_DEPARTMENT_OTHER): Payer: Self-pay | Admitting: Emergency Medicine

## 2013-09-12 ENCOUNTER — Emergency Department (HOSPITAL_BASED_OUTPATIENT_CLINIC_OR_DEPARTMENT_OTHER)
Admission: EM | Admit: 2013-09-12 | Discharge: 2013-09-12 | Disposition: A | Discharge status: Home / Self Care | Payer: Medicaid Other | Attending: Emergency Medicine | Admitting: Emergency Medicine

## 2013-09-12 DIAGNOSIS — N390 Urinary tract infection, site not specified: Secondary | ICD-10-CM | POA: Insufficient documentation

## 2013-09-12 DIAGNOSIS — R0989 Other specified symptoms and signs involving the circulatory and respiratory systems: Secondary | ICD-10-CM | POA: Insufficient documentation

## 2013-09-12 DIAGNOSIS — Z8742 Personal history of other diseases of the female genital tract: Secondary | ICD-10-CM | POA: Insufficient documentation

## 2013-09-12 DIAGNOSIS — R059 Cough, unspecified: Secondary | ICD-10-CM | POA: Insufficient documentation

## 2013-09-12 DIAGNOSIS — M5430 Sciatica, unspecified side: Secondary | ICD-10-CM | POA: Insufficient documentation

## 2013-09-12 DIAGNOSIS — Z792 Long term (current) use of antibiotics: Secondary | ICD-10-CM | POA: Insufficient documentation

## 2013-09-12 DIAGNOSIS — Z8669 Personal history of other diseases of the nervous system and sense organs: Secondary | ICD-10-CM | POA: Insufficient documentation

## 2013-09-12 DIAGNOSIS — R05 Cough: Secondary | ICD-10-CM | POA: Insufficient documentation

## 2013-09-12 DIAGNOSIS — E119 Type 2 diabetes mellitus without complications: Secondary | ICD-10-CM | POA: Insufficient documentation

## 2013-09-12 DIAGNOSIS — Z79899 Other long term (current) drug therapy: Secondary | ICD-10-CM | POA: Insufficient documentation

## 2013-09-12 LAB — URINALYSIS, ROUTINE W REFLEX MICROSCOPIC
Bilirubin Urine: NEGATIVE
Glucose, UA: NEGATIVE mg/dL
Hgb urine dipstick: NEGATIVE
Ketones, ur: NEGATIVE mg/dL
NITRITE: POSITIVE — AB
PH: 6.5 (ref 5.0–8.0)
Protein, ur: NEGATIVE mg/dL
SPECIFIC GRAVITY, URINE: 1.019 (ref 1.005–1.030)
Urobilinogen, UA: 1 mg/dL (ref 0.0–1.0)

## 2013-09-12 LAB — URINE MICROSCOPIC-ADD ON

## 2013-09-12 MED ORDER — HYDROMORPHONE HCL PF 1 MG/ML IJ SOLN
1.0000 mg | Freq: Once | INTRAMUSCULAR | Status: AC
  Administered 2013-09-12: 1 mg via INTRAMUSCULAR
  Filled 2013-09-12: qty 1

## 2013-09-12 MED ORDER — TRAMADOL HCL 50 MG PO TABS: 50.0000 mg | ORAL_TABLET | Freq: Four times a day (QID) | ORAL | Status: DC | PRN

## 2013-09-12 MED ORDER — LEVOFLOXACIN 500 MG PO TABS: 500.0000 mg | ORAL_TABLET | Freq: Every day | ORAL | Status: DC

## 2013-09-12 MED ORDER — ONDANSETRON 4 MG PO TBDP
4.0000 mg | ORAL_TABLET | Freq: Once | ORAL | Status: AC
  Administered 2013-09-12: 4 mg via ORAL
  Filled 2013-09-12: qty 1

## 2013-09-12 MED ORDER — INFLUENZA VAC SPLIT QUAD 0.5 ML IM SUSP
0.5000 mL | INTRAMUSCULAR | Status: AC
  Administered 2013-09-12: 0.5 mL via INTRAMUSCULAR
  Filled 2013-09-12: qty 0.5

## 2013-09-12 MED ORDER — DEXAMETHASONE SODIUM PHOSPHATE 10 MG/ML IJ SOLN
10.0000 mg | Freq: Once | INTRAMUSCULAR | Status: AC
  Administered 2013-09-12: 10 mg via INTRAMUSCULAR
  Filled 2013-09-12: qty 1

## 2013-09-12 NOTE — ED Provider Notes (Signed)
CSN: [id number]     Arrival date & time 09/12/13  1105 History   First MD Initiated Contact with Patient 09/12/13 1220     Chief Complaint  Patient presents with  . sciatica    (Consider location/radiation/quality/duration/timing/severity/associated sxs/prior Treatment) HPI Comments: Patient presents with back pain consistent with her past episodes of sciatica. She hasn't been able to outpatient followup. She has throbbing pain over the last few weeks to her left buttocks radiating down her left leg. She denies any numbness or weakness in the leg. She denies abdominal pain. There's no fevers or chills. She denies any loss of bowel or bladder control. She has appointment to follow with a primary care physician tomorrow. She states the last time she was here she got some shots which significantly helped her pain. This was Dilaudid and Decadron. She also has a history of a recently diagnosed UTI. She was prescribed Keflex but was unable to tolerate that medication. She also has some ongoing cough and lung congestion. She recently had a chest x-ray that was negative for pneumonia.   Past Medical History  Diagnosis Date  . Ovarian cyst   . Retinal detachment   . Cataract   . Diabetes mellitus without complication    Past Surgical History  Procedure Laterality Date  . Eye surgery    . Cesarean section    . Ovarian cyst removal    . Cataract extraction     History reviewed. No pertinent family history. History  Substance Use Topics  . Smoking status: Never Smoker   . Smokeless tobacco: Not on file  . Alcohol Use: No   OB History   Grav Para Term Preterm Abortions TAB SAB Ect Mult Living                 Review of Systems  Constitutional: Negative for fever, chills, diaphoresis and fatigue.  HENT: Negative for congestion, rhinorrhea and sneezing.   Eyes: Negative.   Respiratory: Negative for cough, chest tightness and shortness of breath.   Cardiovascular: Negative for chest pain and  leg swelling.  Gastrointestinal: Negative for nausea, vomiting, abdominal pain, diarrhea and blood in stool.  Genitourinary: Positive for dysuria. Negative for frequency, hematuria, flank pain and difficulty urinating.  Musculoskeletal: Positive for back pain. Negative for arthralgias.  Skin: Negative for rash.  Neurological: Negative for dizziness, speech difficulty, weakness, numbness and headaches.    Allergies  Review of patient's allergies indicates no known allergies.  Home Medications   Current Outpatient Rx  Name  Route  Sig  Dispense  Refill  . cephALEXin (KEFLEX) 500 MG capsule   Oral   Take 1 capsule (500 mg total) by mouth 4 (four) times daily.   28 capsule   0   . HYDROcodone-acetaminophen (NORCO/VICODIN) 5-325 MG per tablet   Oral   Take 2 tablets by mouth every 4 (four) hours as needed for pain.   15 tablet   0   . metFORMIN (GLUCOPHAGE) 500 MG tablet   Oral   Take 500 mg by mouth 2 (two) times daily with a meal.         . traMADol (ULTRAM) 50 MG tablet   Oral   Take 1 tablet (50 mg total) by mouth every 6 (six) hours as needed for pain.   15 tablet   0    BP 162/81  Pulse 72  Temp(Src) 98.4 F (36.9 C) (Oral)  Resp 18  Ht 5\' 9"  (1.753 m)  Wt 360  lb 4.8 oz (163.431 kg)  BMI 53.18 kg/m2  SpO2 97% Physical Exam  Constitutional: She is oriented to person, place, and time. She appears well-developed and well-nourished.  Morbidly obese  HENT:  Head: Normocephalic and atraumatic.  Eyes: Pupils are equal, round, and reactive to light.  Neck: Normal range of motion. Neck supple.  Cardiovascular: Normal rate, regular rhythm and normal heart sounds.   Pulmonary/Chest: Effort normal and breath sounds normal. No respiratory distress. She has no wheezes. She has no rales. She exhibits no tenderness.  Abdominal: Soft. Bowel sounds are normal. There is no tenderness. There is no rebound and no guarding.  Musculoskeletal: Normal range of motion. She exhibits  edema.  Pain over palpation of the left sciatic nerve. Negative straight leg raise bilaterally. She has normal sensation in the feet. Normal motor function in the legs.  Lymphadenopathy:    She has no cervical adenopathy.  Neurological: She is alert and oriented to person, place, and time.  Skin: Skin is warm and dry. No rash noted.  Psychiatric: She has a normal mood and affect.    ED Course  Procedures (including critical care time) Labs Review Labs Reviewed  URINALYSIS, ROUTINE W REFLEX MICROSCOPIC - Abnormal; Notable for the following:    APPearance CLOUDY (*)    Nitrite POSITIVE (*)    Leukocytes, UA SMALL (*)    All other components within normal limits  URINE MICROSCOPIC-ADD ON - Abnormal; Notable for the following:    Bacteria, UA MANY (*)    All other components within normal limits  URINE CULTURE   Imaging Review No results found.  EKG Interpretation   None       MDM   1. Sciatica    Patient with evidence of ongoing UTI. Given her chest congestion I will go ahead and treat her with Levaquin which should cover both. I don't hear any signs of pneumonia on exam. She has pain consistent with her past episodes of sciatica. Chest no neurologic deficits. She was given a shot of Dilantin and Decadron in the ED. I encouraged her to follow her blood sugars closely. She states she just checked it earlier today and it was fine. She has appointment to follow with her primary care physician tomorrow.    Malvin Johns, MD 09/12/13 [phone]

## 2013-09-12 NOTE — ED Notes (Signed)
Continues to have pain that was diagnosed as sciatica a few weeks ago has tried to get into regular doctor . Also states she thinks she has a uti. Was given keflex to treat the uti and states she could not tolerate the medications.

## 2013-09-12 NOTE — Discharge Instructions (Signed)
Sciatica °Sciatica is pain, weakness, numbness, or tingling along the path of the sciatic nerve. The nerve starts in the lower back and runs down the back of each leg. The nerve controls the muscles in the lower leg and in the back of the knee, while also providing sensation to the back of the thigh, lower leg, and the sole of your foot. Sciatica is a symptom of another medical condition. For instance, nerve damage or certain conditions, such as a herniated disk or bone spur on the spine, pinch or put pressure on the sciatic nerve. This causes the pain, weakness, or other sensations normally associated with sciatica. Generally, sciatica only affects one side of the body. °CAUSES  °· Herniated or slipped disc. °· Degenerative disk disease. °· A pain disorder involving the narrow muscle in the buttocks (piriformis syndrome). °· Pelvic injury or fracture. °· Pregnancy. °· Tumor (rare). °SYMPTOMS  °Symptoms can vary from mild to very severe. The symptoms usually travel from the low back to the buttocks and down the back of the leg. Symptoms can include: °· Mild tingling or dull aches in the lower back, leg, or hip. °· Numbness in the back of the calf or sole of the foot. °· Burning sensations in the lower back, leg, or hip. °· Sharp pains in the lower back, leg, or hip. °· Leg weakness. °· Severe back pain inhibiting movement. °These symptoms may get worse with coughing, sneezing, laughing, or prolonged sitting or standing. Also, being overweight may worsen symptoms. °DIAGNOSIS  °Your caregiver will perform a physical exam to look for common symptoms of sciatica. He or she may ask you to do certain movements or activities that would trigger sciatic nerve pain. Other tests may be performed to find the cause of the sciatica. These may include: °· Blood tests. °· X-rays. °· Imaging tests, such as an MRI or CT scan. °TREATMENT  °Treatment is directed at the cause of the sciatic pain. Sometimes, treatment is not necessary  and the pain and discomfort goes away on its own. If treatment is needed, your caregiver may suggest: °· Over-the-counter medicines to relieve pain. °· Prescription medicines, such as anti-inflammatory medicine, muscle relaxants, or narcotics. °· Applying heat or ice to the painful area. °· Steroid injections to lessen pain, irritation, and inflammation around the nerve. °· Reducing activity during periods of pain. °· Exercising and stretching to strengthen your abdomen and improve flexibility of your spine. Your caregiver may suggest losing weight if the extra weight makes the back pain worse. °· Physical therapy. °· Surgery to eliminate what is pressing or pinching the nerve, such as a bone spur or part of a herniated disk. °HOME CARE INSTRUCTIONS  °· Only take over-the-counter or prescription medicines for pain or discomfort as directed by your caregiver. °· Apply ice to the affected area for 20 minutes, 3 4 times a day for the first 48 72 hours. Then try heat in the same way. °· Exercise, stretch, or perform your usual activities if these do not aggravate your pain. °· Attend physical therapy sessions as directed by your caregiver. °· Keep all follow-up appointments as directed by your caregiver. °· Do not wear high heels or shoes that do not provide proper support. °· Check your mattress to see if it is too soft. A firm mattress may lessen your pain and discomfort. °SEEK IMMEDIATE MEDICAL CARE IF:  °· You lose control of your bowel or bladder (incontinence). °· You have increasing weakness in the lower back,   pelvis, buttocks, or legs.  You have redness or swelling of your back.  You have a burning sensation when you urinate.  You have pain that gets worse when you lie down or awakens you at night.  Your pain is worse than you have experienced in the past.  Your pain is lasting longer than 4 weeks.  You are suddenly losing weight without reason. MAKE SURE YOU:  Understand these  instructions.  Will watch your condition.  Will get help right away if you are not doing well or get worse. Document Released: 07/29/2001 Document Revised: 02/03/2012 Document Reviewed: 12/14/2011 [facility] Patient Information 2014 [place].  Sciatica Sciatica is pain, weakness, numbness, or tingling along the path of the sciatic nerve. The nerve starts in the lower back and runs down the back of each leg. The nerve controls the muscles in the lower leg and in the back of the knee, while also providing sensation to the back of the thigh, lower leg, and the sole of your foot. Sciatica is a symptom of another medical condition. For instance, nerve damage or certain conditions, such as a herniated disk or bone spur on the spine, pinch or put pressure on the sciatic nerve. This causes the pain, weakness, or other sensations normally associated with sciatica. Generally, sciatica only affects one side of the body. CAUSES   Herniated or slipped disc.  Degenerative disk disease.  A pain disorder involving the narrow muscle in the buttocks (piriformis syndrome).  Pelvic injury or fracture.  Pregnancy.  Tumor (rare). SYMPTOMS  Symptoms can vary from mild to very severe. The symptoms usually travel from the low back to the buttocks and down the back of the leg. Symptoms can include:  Mild tingling or dull aches in the lower back, leg, or hip.  Numbness in the back of the calf or sole of the foot.  Burning sensations in the lower back, leg, or hip.  Sharp pains in the lower back, leg, or hip.  Leg weakness.  Severe back pain inhibiting movement. These symptoms may get worse with coughing, sneezing, laughing, or prolonged sitting or standing. Also, being overweight may worsen symptoms. DIAGNOSIS  Your caregiver will perform a physical exam to look for common symptoms of sciatica. He or she may ask you to do certain movements or activities that would trigger sciatic nerve pain. Other  tests may be performed to find the cause of the sciatica. These may include:  Blood tests.  X-rays.  Imaging tests, such as an MRI or CT scan. TREATMENT  Treatment is directed at the cause of the sciatic pain. Sometimes, treatment is not necessary and the pain and discomfort goes away on its own. If treatment is needed, your caregiver may suggest:  Over-the-counter medicines to relieve pain.  Prescription medicines, such as anti-inflammatory medicine, muscle relaxants, or narcotics.  Applying heat or ice to the painful area.  Steroid injections to lessen pain, irritation, and inflammation around the nerve.  Reducing activity during periods of pain.  Exercising and stretching to strengthen your abdomen and improve flexibility of your spine. Your caregiver may suggest losing weight if the extra weight makes the back pain worse.  Physical therapy.  Surgery to eliminate what is pressing or pinching the nerve, such as a bone spur or part of a herniated disk. HOME CARE INSTRUCTIONS   Only take over-the-counter or prescription medicines for pain or discomfort as directed by your caregiver.  Apply ice to the affected area for 20 minutes, 3  4 times a day for the first 48 72 hours. Then try heat in the same way.  Exercise, stretch, or perform your usual activities if these do not aggravate your pain.  Attend physical therapy sessions as directed by your caregiver.  Keep all follow-up appointments as directed by your caregiver.  Do not wear high heels or shoes that do not provide proper support.  Check your mattress to see if it is too soft. A firm mattress may lessen your pain and discomfort. SEEK IMMEDIATE MEDICAL CARE IF:   You lose control of your bowel or bladder (incontinence).  You have increasing weakness in the lower back, pelvis, buttocks, or legs.  You have redness or swelling of your back.  You have a burning sensation when you urinate.  You have pain that gets  worse when you lie down or awakens you at night.  Your pain is worse than you have experienced in the past.  Your pain is lasting longer than 4 weeks.  You are suddenly losing weight without reason. MAKE SURE YOU:  Understand these instructions.  Will watch your condition.  Will get help right away if you are not doing well or get worse. Document Released: 07/29/2001 Document Revised: 02/03/2012 Document Reviewed: 12/14/2011 [facility] Patient Information 2014 [place].

## 2013-09-13 LAB — URINE CULTURE: Colony Count: >=100000

## 2013-09-16 NOTE — ED Notes (Signed)
+   Urine Patient treated with Levofloxacin-sensitive to same-chart appended per protocol MD.

## 2014-05-03 ENCOUNTER — Other Ambulatory Visit: Payer: Self-pay | Admitting: Sports Medicine

## 2014-05-03 DIAGNOSIS — M545 Low back pain, unspecified: Secondary | ICD-10-CM

## 2014-05-03 DIAGNOSIS — M5412 Radiculopathy, cervical region: Secondary | ICD-10-CM

## 2014-05-03 DIAGNOSIS — M5416 Radiculopathy, lumbar region: Secondary | ICD-10-CM

## 2014-05-03 DIAGNOSIS — M542 Cervicalgia: Secondary | ICD-10-CM

## 2014-05-14 ENCOUNTER — Ambulatory Visit
Admission: RE | Admit: 2014-05-14 | Discharge: 2014-05-14 | Disposition: A | Discharge status: Home / Self Care | Payer: Medicaid Other | Source: Ambulatory Visit | Attending: Sports Medicine | Admitting: Sports Medicine

## 2014-05-14 DIAGNOSIS — M542 Cervicalgia: Secondary | ICD-10-CM

## 2014-05-14 DIAGNOSIS — M5416 Radiculopathy, lumbar region: Secondary | ICD-10-CM

## 2014-05-14 DIAGNOSIS — M545 Low back pain, unspecified: Secondary | ICD-10-CM

## 2014-05-14 DIAGNOSIS — M5412 Radiculopathy, cervical region: Secondary | ICD-10-CM

## 2014-07-24 ENCOUNTER — Emergency Department (HOSPITAL_BASED_OUTPATIENT_CLINIC_OR_DEPARTMENT_OTHER)
Admission: EM | Admit: 2014-07-24 | Discharge: 2014-07-24 | Disposition: A | Discharge status: Home / Self Care | Payer: Medicaid Other | Attending: Emergency Medicine | Admitting: Emergency Medicine

## 2014-07-24 ENCOUNTER — Emergency Department (HOSPITAL_BASED_OUTPATIENT_CLINIC_OR_DEPARTMENT_OTHER): Payer: Medicaid Other

## 2014-07-24 ENCOUNTER — Encounter (HOSPITAL_BASED_OUTPATIENT_CLINIC_OR_DEPARTMENT_OTHER): Payer: Self-pay | Admitting: Emergency Medicine

## 2014-07-24 DIAGNOSIS — Z79899 Other long term (current) drug therapy: Secondary | ICD-10-CM | POA: Diagnosis not present

## 2014-07-24 DIAGNOSIS — R059 Cough, unspecified: Secondary | ICD-10-CM

## 2014-07-24 DIAGNOSIS — R05 Cough: Secondary | ICD-10-CM | POA: Diagnosis present

## 2014-07-24 DIAGNOSIS — J40 Bronchitis, not specified as acute or chronic: Secondary | ICD-10-CM | POA: Insufficient documentation

## 2014-07-24 DIAGNOSIS — Z8669 Personal history of other diseases of the nervous system and sense organs: Secondary | ICD-10-CM | POA: Insufficient documentation

## 2014-07-24 DIAGNOSIS — Z8701 Personal history of pneumonia (recurrent): Secondary | ICD-10-CM | POA: Diagnosis not present

## 2014-07-24 DIAGNOSIS — Z8742 Personal history of other diseases of the female genital tract: Secondary | ICD-10-CM | POA: Insufficient documentation

## 2014-07-24 DIAGNOSIS — E119 Type 2 diabetes mellitus without complications: Secondary | ICD-10-CM | POA: Insufficient documentation

## 2014-07-24 MED ORDER — IPRATROPIUM BROMIDE 0.02 % IN SOLN: 0.5000 mg | Freq: Once | RESPIRATORY_TRACT | Status: DC

## 2014-07-24 MED ORDER — ALBUTEROL SULFATE HFA 108 (90 BASE) MCG/ACT IN AERS: 2.0000 | INHALATION_SPRAY | RESPIRATORY_TRACT | Status: DC | PRN

## 2014-07-24 MED ORDER — AEROCHAMBER PLUS FLO-VU MEDIUM MISC
1.0000 | Freq: Once | Status: AC
  Administered 2014-07-24: 1
  Filled 2014-07-24: qty 1

## 2014-07-24 MED ORDER — ALBUTEROL SULFATE (2.5 MG/3ML) 0.083% IN NEBU
2.5000 mg | INHALATION_SOLUTION | Freq: Once | RESPIRATORY_TRACT | Status: AC
  Administered 2014-07-24: 2.5 mg via RESPIRATORY_TRACT

## 2014-07-24 MED ORDER — ALBUTEROL SULFATE (2.5 MG/3ML) 0.083% IN NEBU
5.0000 mg | INHALATION_SOLUTION | Freq: Once | RESPIRATORY_TRACT | Status: AC
  Administered 2014-07-24: 5 mg via RESPIRATORY_TRACT
  Filled 2014-07-24: qty 6

## 2014-07-24 MED ORDER — ALBUTEROL SULFATE (2.5 MG/3ML) 0.083% IN NEBU
INHALATION_SOLUTION | RESPIRATORY_TRACT | Status: AC
  Administered 2014-07-24: 09:00:00 2.5 mg via RESPIRATORY_TRACT
  Filled 2014-07-24: qty 3

## 2014-07-24 MED ORDER — IPRATROPIUM-ALBUTEROL 0.5-2.5 (3) MG/3ML IN SOLN
3.0000 mL | RESPIRATORY_TRACT | Status: DC
  Administered 2014-07-24: 3 mL via RESPIRATORY_TRACT

## 2014-07-24 MED ORDER — IPRATROPIUM-ALBUTEROL 0.5-2.5 (3) MG/3ML IN SOLN
RESPIRATORY_TRACT | Status: AC
  Administered 2014-07-24: 09:00:00 3 mL via RESPIRATORY_TRACT
  Filled 2014-07-24: qty 3

## 2014-07-24 NOTE — ED Notes (Signed)
Pt seen at [facility] two weeks ago.  Pt was admitted for pneumonia and was discharged with albuterol nebulizer treatments but was not given the machine.  Pt presents today with increased wheezing and cough.

## 2014-07-24 NOTE — ED Provider Notes (Signed)
CSN: [id number]     Arrival date & time 07/24/14  7619 History   First MD Initiated Contact with Patient 07/24/14 0920     Chief Complaint  Patient presents with  . Cough    (Consider location/radiation/quality/duration/timing/severity/associated sxs/prior Treatment) HPI Yesenia Howard is a 58 yo female presenting with continued cough and wheezing.  She reports she was admitted to the hospital 2 weeks ago and treated for pneumonia.  She stayed overnight and was discharged.  After her discharge she notes the productive cough improved but she has continued to have a dry cough and wheezing has continued.  She was prescribed levaquin and prednisone and albuterol solution but does not have the nebulizer to use the medicine. She endorses she has had some nasal congestion but denies any fever, chills, chest pain, nausea or vomiting.        Past Medical History  Diagnosis Date  . Ovarian cyst   . Retinal detachment   . Cataract   . Diabetes mellitus without complication    Past Surgical History  Procedure Laterality Date  . Eye surgery    . Cesarean section    . Ovarian cyst removal    . Cataract extraction     No family history on file. History  Substance Use Topics  . Smoking status: Never Smoker   . Smokeless tobacco: Not on file  . Alcohol Use: No   OB History    No data available     Review of Systems  Constitutional: Negative for fever and chills.  HENT: Positive for congestion and rhinorrhea. Negative for sore throat.   Eyes: Negative for visual disturbance.  Respiratory: Positive for cough and wheezing. Negative for shortness of breath.   Cardiovascular: Negative for chest pain and leg swelling.  Gastrointestinal: Negative for nausea, vomiting and diarrhea.  Genitourinary: Negative for dysuria.  Musculoskeletal: Negative for myalgias.  Skin: Negative for rash.  Neurological: Negative for weakness, numbness and headaches.     Allergies  Review of patient's  allergies indicates no known allergies.  Home Medications   Prior to Admission medications   Medication Sig Start Date End Date Taking? Authorizing Provider  albuterol (PROVENTIL HFA;VENTOLIN HFA) 108 (90 BASE) MCG/ACT inhaler Inhale into the lungs every 6 (six) hours as needed for wheezing or shortness of breath.   Yes Historical Provider, MD  benzonatate (TESSALON) 100 MG capsule Take 100 mg by mouth every 6 (six) hours as needed for cough.   Yes Historical Provider, MD  guaiFENesin-codeine (CHERATUSSIN AC) 100-10 MG/5ML syrup Take 5 mLs by mouth 3 (three) times daily as needed for cough.   Yes Historical Provider, MD  albuterol (PROVENTIL) (2.5 MG/3ML) 0.083% nebulizer solution Take 2.5 mg by nebulization every 6 (six) hours as needed for wheezing or shortness of breath.    Historical Provider, MD  metFORMIN (GLUCOPHAGE) 500 MG tablet Take 500 mg by mouth 2 (two) times daily with a meal.    Historical Provider, MD   BP 145/97 mmHg  Pulse 87  Temp(Src) 98.3 F (36.8 C) (Oral)  Resp 18  Ht 5\' 9"  (1.753 m)  Wt 333 lb 5 oz (151.19 kg)  BMI 49.20 kg/m2  SpO2 97% Physical Exam  Constitutional: She appears well-developed and well-nourished. No distress.  HENT:  Head: Normocephalic and atraumatic.  Mouth/Throat: Oropharynx is clear and moist. No oropharyngeal exudate.  Eyes: Conjunctivae are normal.  Neck: Neck supple. No thyromegaly present.  Cardiovascular: Normal rate, regular rhythm and intact distal pulses.  Pulmonary/Chest: Effort normal. No respiratory distress. She has wheezes in the right middle field, the right lower field, the left middle field and the left lower field. She has no rales. She exhibits no tenderness.  Abdominal: Soft. There is no tenderness.  Musculoskeletal: She exhibits no tenderness.  Lymphadenopathy:    She has no cervical adenopathy.  Neurological: She is alert.  Skin: Skin is warm and dry. No rash noted. She is not diaphoretic.  Psychiatric: She has a  normal mood and affect.  Nursing note and vitals reviewed.   ED Course  Procedures (including critical care time) Labs Review Labs Reviewed - No data to display  Imaging Review Dg Chest 2 View  07/24/2014   CLINICAL DATA:  Cough and wheezing for 2 months  EXAM: CHEST  2 VIEW  COMPARISON:  September 01, 2013  FINDINGS: The heart size and mediastinal contours are within normal limits. There is no focal infiltrate, pulmonary edema, or pleural effusion. The visualized skeletal structures are unremarkable.  IMPRESSION: No active cardiopulmonary disease.   Electronically Signed   By: Abelardo Diesel M.D.   On: 07/24/2014 10:10     EKG Interpretation None      MDM   Final diagnoses:  Cough  Bronchitis   58 yo presenting with continued cough after past diagnosis of pneumonia and bronchitis.  Her symptoms are consistent with viral bronchitis as she is afebrile with no other complaints except nasal congestion, coughing and wheezing.  Her CXR negative for acute infiltrate.  Discussed that antibiotics are not indicated for viral infections. Pt will be discharged with symptomatic treatment. Pt is well-appearing, in no acute distress and vital signs are stable.  They appear safe to be discharged.  Discharge include follow-up with their PCP.  Return precautions provided. Verbalizes understanding and is agreeable with plan.    Filed Vitals:   07/24/14 0901 07/24/14 0903 07/24/14 1104 07/24/14 1130  BP: 145/97   139/72  Pulse: 87     Temp: 98.3 F (36.8 C)     TempSrc: Oral     Resp: 18   18  Height: 5\' 9"  (1.753 m)     Weight: 333 lb 5 oz (151.19 kg)     SpO2: 97% 97% 100% 97%   Meds given in ED:  Medications  ipratropium-albuterol (DUONEB) 0.5-2.5 (3) MG/3ML nebulizer solution 3 mL (0 mLs Nebulization Duplicate 63/3/35 4562)  albuterol (PROVENTIL) (2.5 MG/3ML) 0.083% nebulizer solution 2.5 mg (2.5 mg Nebulization Given 07/24/14 0915)  albuterol (PROVENTIL) (2.5 MG/3ML) 0.083% nebulizer  solution 5 mg (5 mg Nebulization Given 07/24/14 1104)  AEROCHAMBER PLUS FLO-VU MEDIUM device MISC 1 each (1 each Other Given 07/24/14 1125)    New Prescriptions   ALBUTEROL (PROVENTIL HFA;VENTOLIN HFA) 108 (90 BASE) MCG/ACT INHALER    Inhale 2 puffs into the lungs every 4 (four) hours as needed for wheezing or shortness of breath.       Britt Bottom, NP 07/24/14 [place], MD 07/24/14 1319

## 2014-07-24 NOTE — Discharge Instructions (Signed)
Please follow the directions provided.  Be sure to follow-up with your primary care provider this week. To ensure you are getting better.  Use the albuterol MDI 2 puffs every 4 hours for cough and wheezing.  You may need to discuss with your doctor adding a daily inhaler to help decrease your symptoms.  Don't hesitate to return for any new, worsening or concerning symptoms.     SEEK IMMEDIATE MEDICAL CARE IF:  You develop an increased fever or chills.  You have chest pain.  You have severe shortness of breath.  You have bloody sputum.  You develop dehydration.  You faint or repeatedly feel like you are going to pass out.  You develop repeated vomiting.  You develop a severe headache.

## 2014-07-25 ENCOUNTER — Encounter (HOSPITAL_COMMUNITY): Payer: Self-pay | Admitting: Emergency Medicine

## 2014-07-25 ENCOUNTER — Emergency Department (HOSPITAL_COMMUNITY)
Admission: EM | Admit: 2014-07-25 | Discharge: 2014-07-25 | Disposition: A | Discharge status: Home / Self Care | Payer: Medicaid Other | Attending: Emergency Medicine | Admitting: Emergency Medicine

## 2014-07-25 ENCOUNTER — Emergency Department (HOSPITAL_COMMUNITY): Payer: Medicaid Other

## 2014-07-25 DIAGNOSIS — Z8742 Personal history of other diseases of the female genital tract: Secondary | ICD-10-CM | POA: Insufficient documentation

## 2014-07-25 DIAGNOSIS — Z79899 Other long term (current) drug therapy: Secondary | ICD-10-CM | POA: Diagnosis not present

## 2014-07-25 DIAGNOSIS — J209 Acute bronchitis, unspecified: Secondary | ICD-10-CM | POA: Diagnosis not present

## 2014-07-25 DIAGNOSIS — Z8701 Personal history of pneumonia (recurrent): Secondary | ICD-10-CM | POA: Insufficient documentation

## 2014-07-25 DIAGNOSIS — R0602 Shortness of breath: Secondary | ICD-10-CM | POA: Diagnosis present

## 2014-07-25 DIAGNOSIS — E119 Type 2 diabetes mellitus without complications: Secondary | ICD-10-CM | POA: Diagnosis not present

## 2014-07-25 DIAGNOSIS — R000 Tachycardia, unspecified: Secondary | ICD-10-CM | POA: Diagnosis not present

## 2014-07-25 DIAGNOSIS — Z8669 Personal history of other diseases of the nervous system and sense organs: Secondary | ICD-10-CM | POA: Diagnosis not present

## 2014-07-25 DIAGNOSIS — J40 Bronchitis, not specified as acute or chronic: Secondary | ICD-10-CM

## 2014-07-25 LAB — I-STAT TROPONIN, ED: TROPONIN I, POC: 0 ng/mL (ref 0.00–0.08)

## 2014-07-25 LAB — CBC
HCT: 36.2 % (ref 36.0–46.0)
Hemoglobin: 11.5 g/dL — ABNORMAL LOW (ref 12.0–15.0)
MCH: 28.4 pg (ref 26.0–34.0)
MCHC: 31.8 g/dL (ref 30.0–36.0)
MCV: 89.4 fL (ref 78.0–100.0)
PLATELETS: 222 10*3/uL (ref 150–400)
RBC: 4.05 MIL/uL (ref 3.87–5.11)
RDW: 14.2 % (ref 11.5–15.5)
WBC: 8.3 10*3/uL (ref 4.0–10.5)

## 2014-07-25 LAB — BASIC METABOLIC PANEL
ANION GAP: 16 — AB (ref 5–15)
BUN: 13 mg/dL (ref 6–23)
CHLORIDE: 102 meq/L (ref 96–112)
CO2: 21 mEq/L (ref 19–32)
Calcium: 9.9 mg/dL (ref 8.4–10.5)
Creatinine, Ser: 0.64 mg/dL (ref 0.50–1.10)
Glucose, Bld: 179 mg/dL — ABNORMAL HIGH (ref 70–99)
POTASSIUM: 4 meq/L (ref 3.7–5.3)
SODIUM: 139 meq/L (ref 137–147)

## 2014-07-25 LAB — PRO B NATRIURETIC PEPTIDE: PRO B NATRI PEPTIDE: 92.3 pg/mL (ref 0–125)

## 2014-07-25 MED ORDER — ALBUTEROL (5 MG/ML) CONTINUOUS INHALATION SOLN
10.0000 mg/h | INHALATION_SOLUTION | Freq: Once | RESPIRATORY_TRACT | Status: AC
  Administered 2014-07-25: 10 mg/h via RESPIRATORY_TRACT
  Filled 2014-07-25: qty 20

## 2014-07-25 MED ORDER — AZITHROMYCIN 250 MG PO TABS: 250.0000 mg | ORAL_TABLET | Freq: Every day | ORAL | Status: DC

## 2014-07-25 MED ORDER — ALBUTEROL SULFATE HFA 108 (90 BASE) MCG/ACT IN AERS: 1.0000 | INHALATION_SPRAY | Freq: Four times a day (QID) | RESPIRATORY_TRACT | Status: DC | PRN

## 2014-07-25 MED ORDER — HYDROCODONE-ACETAMINOPHEN 5-325 MG PO TABS: 1.0000 | ORAL_TABLET | Freq: Four times a day (QID) | ORAL | Status: DC | PRN

## 2014-07-25 MED ORDER — IPRATROPIUM BROMIDE 0.02 % IN SOLN
0.5000 mg | Freq: Once | RESPIRATORY_TRACT | Status: AC
Start: 2014-07-25 — End: 2014-07-25
  Administered 2014-07-25: 0.5 mg via RESPIRATORY_TRACT
  Filled 2014-07-25: qty 2.5

## 2014-07-25 MED ORDER — BENZONATATE 100 MG PO CAPS
100.0000 mg | ORAL_CAPSULE | Freq: Once | ORAL | Status: AC
  Administered 2014-07-25: 100 mg via ORAL
  Filled 2014-07-25: qty 1

## 2014-07-25 MED ORDER — HYDROCODONE-ACETAMINOPHEN 5-325 MG PO TABS
2.0000 | ORAL_TABLET | Freq: Once | ORAL | Status: AC
  Administered 2014-07-25: 2 via ORAL
  Filled 2014-07-25: qty 2

## 2014-07-25 NOTE — ED Provider Notes (Signed)
CSN: [id number]     Arrival date & time 07/25/14  1810 History   First MD Initiated Contact with Patient 07/25/14 1813     Chief Complaint  Patient presents with  . Shortness of Breath     (Consider location/radiation/quality/duration/timing/severity/associated sxs/prior Treatment) Patient is a 58 y.o. female presenting with shortness of breath. The history is provided by the patient.  Shortness of Breath Severity:  Moderate Onset quality:  Gradual Timing:  Constant Progression:  Unchanged Chronicity:  Recurrent Context: URI   Context comment:  Recent pneumonia 2 weeks ago Relieved by: albuterol. Worsened by:  Nothing tried Associated symptoms: cough   Associated symptoms: no chest pain and no fever     Past Medical History  Diagnosis Date  . Ovarian cyst   . Retinal detachment   . Cataract   . Diabetes mellitus without complication    Past Surgical History  Procedure Laterality Date  . Eye surgery    . Cesarean section    . Ovarian cyst removal    . Cataract extraction     No family history on file. History  Substance Use Topics  . Smoking status: Never Smoker   . Smokeless tobacco: Not on file  . Alcohol Use: No   OB History    No data available     Review of Systems  Constitutional: Negative for fever.  Respiratory: Positive for cough and shortness of breath.   Cardiovascular: Negative for chest pain and leg swelling.  All other systems reviewed and are negative.     Allergies  Review of patient's allergies indicates no known allergies.  Home Medications   Prior to Admission medications   Medication Sig Start Date End Date Taking? Authorizing Provider  albuterol (PROVENTIL HFA;VENTOLIN HFA) 108 (90 BASE) MCG/ACT inhaler Inhale 2 puffs into the lungs every 4 (four) hours as needed for wheezing or shortness of breath. 07/24/14   Britt Bottom, NP  benzonatate (TESSALON) 100 MG capsule Take 100 mg by mouth every 6 (six) hours as needed for cough.     Historical Provider, MD  guaiFENesin-codeine (CHERATUSSIN AC) 100-10 MG/5ML syrup Take 5 mLs by mouth 3 (three) times daily as needed for cough.    Historical Provider, MD  metFORMIN (GLUCOPHAGE) 500 MG tablet Take 500 mg by mouth 2 (two) times daily with a meal.    Historical Provider, MD   BP 174/91 mmHg  Pulse 120  Temp(Src) 98.7 F (37.1 C) (Oral)  Resp 24  SpO2 93% Physical Exam  Constitutional: She is oriented to person, place, and time. She appears well-developed and well-nourished. No distress.  HENT:  Head: Normocephalic and atraumatic.  Mouth/Throat: Oropharynx is clear and moist.  Eyes: EOM are normal. Pupils are equal, round, and reactive to light.  Neck: Normal range of motion. Neck supple.  Cardiovascular: Normal rate and regular rhythm.  Exam reveals no friction rub.   No murmur heard. Pulmonary/Chest: She is in respiratory distress. She has wheezes (diffusely). She has no rales.  Abdominal: Soft. She exhibits no distension. There is no tenderness. There is no rebound.  Musculoskeletal: Normal range of motion. She exhibits no edema.  Neurological: She is alert and oriented to person, place, and time.  Skin: No rash noted. She is not diaphoretic.  Nursing note and vitals reviewed.   ED Course  Procedures (including critical care time) Labs Review Labs Reviewed  CBC - Abnormal; Notable for the following:    Hemoglobin 11.5 (*)    All other components  within normal limits  BASIC METABOLIC PANEL - Abnormal; Notable for the following:    Glucose, Bld 179 (*)    Anion gap 16 (*)    All other components within normal limits  PRO B NATRIURETIC PEPTIDE  I-STAT TROPOININ, ED    Imaging Review Dg Chest 2 View  07/25/2014   CLINICAL DATA:  Continued productive cough with thick brown sputum, headache, abdominal pain, hospital admission 1 month ago for similar symptoms, shortness of breath, history diabetes  EXAM: CHEST  2 VIEW  COMPARISON:  07/24/2014  FINDINGS:  Normal heart size, mediastinal contours and pulmonary vascularity.  Peribronchial thickening without infiltrate, pleural effusion or pneumothorax.  Osseous structures unremarkable.  IMPRESSION: Mild bronchitic changes without infiltrate.   Electronically Signed   By: Lavonia Dana M.D.   On: 07/25/2014 18:57   Dg Chest 2 View  07/24/2014   CLINICAL DATA:  Cough and wheezing for 2 months  EXAM: CHEST  2 VIEW  COMPARISON:  September 01, 2013  FINDINGS: The heart size and mediastinal contours are within normal limits. There is no focal infiltrate, pulmonary edema, or pleural effusion. The visualized skeletal structures are unremarkable.  IMPRESSION: No active cardiopulmonary disease.   Electronically Signed   By: Abelardo Diesel M.D.   On: 07/24/2014 10:10     EKG Interpretation None       Date: 07/25/2014  Rate: 119  Rhythm: sinus tachycardia  QRS Axis: normal  Intervals: normal  ST/T Wave abnormalities: normal  Conduction Disutrbances:none  Narrative Interpretation:   Old EKG Reviewed: unchanged and tachy new, but morphology unchanged    MDM   Final diagnoses:  Shortness of breath  Bronchitis    37F with hx of diabetes, (no history of COPD, Asthma) presents with persistent SOB. Admitted at [facility] for 1 night for PNA - finished outpatient antibiotics and steroids. Seen yesterday for SOB - given albuterol, no other steroids given. Continued SOB since getting discharged from the ED last night. Rhinorrhea, mild URI symptoms also. On exam, tachycardic, wheezing diffusely. Will give continuous albuterol. Given steroids by EMS (per her). CXR shows bronchitic changes, no PNA. Patient feeling better after albuterol, hydrocodone for cough and pain.  Stable for discharge, given vicodin, albuterol, antibiotics. Failed to give steroids, contacted Flow Manager who will call it into the pharmacy and attempt to contact patient.      Evelina Bucy, MD 07/26/14 0005

## 2014-07-25 NOTE — ED Notes (Signed)
Bed: LD44 Expected date:  Expected time:  Means of arrival:  Comments: 11f pna, sob - no bp no o2

## 2014-07-25 NOTE — Progress Notes (Signed)
07/25/2014 A. Maxemiliano Riel RNCM 2345pm EDCM called patient x2 without success to follow up with patient regarding possible medication assistance.

## 2014-07-25 NOTE — ED Notes (Addendum)
Pt c/o SOB, expiratory wheezing. Was seen at [facility] for same sx and sent home with prescriptions could not get filled. Had 1 neb tx in route.

## 2014-07-25 NOTE — Discharge Instructions (Signed)
Cough, Adult ° A cough is a reflex that helps clear your throat and airways. It can help heal the body or may be a reaction to an irritated airway. A cough may only last 2 or 3 weeks (acute) or may last more than 8 weeks (chronic).  °CAUSES °Acute cough: °· Viral or bacterial infections. °Chronic cough: °· Infections. °· Allergies. °· Asthma. °· Post-nasal drip. °· Smoking. °· Heartburn or acid reflux. °· Some medicines. °· Chronic lung problems (COPD). °· Cancer. °SYMPTOMS  °· Cough. °· Fever. °· Chest pain. °· Increased breathing rate. °· High-pitched whistling sound when breathing (wheezing). °· Colored mucus that you cough up (sputum). °TREATMENT  °· A bacterial cough may be treated with antibiotic medicine. °· A viral cough must run its course and will not respond to antibiotics. °· Your caregiver may recommend other treatments if you have a chronic cough. °HOME CARE INSTRUCTIONS  °· Only take over-the-counter or prescription medicines for pain, discomfort, or fever as directed by your caregiver. Use cough suppressants only as directed by your caregiver. °· Use a cold steam vaporizer or humidifier in your bedroom or home to help loosen secretions. °· Sleep in a semi-upright position if your cough is worse at night. °· Rest as needed. °· Stop smoking if you smoke. °SEEK IMMEDIATE MEDICAL CARE IF:  °· You have pus in your sputum. °· Your cough starts to worsen. °· You cannot control your cough with suppressants and are losing sleep. °· You begin coughing up blood. °· You have difficulty breathing. °· You develop pain which is getting worse or is uncontrolled with medicine. °· You have a fever. °MAKE SURE YOU:  °· Understand these instructions. °· Will watch your condition. °· Will get help right away if you are not doing well or get worse. °Document Released: 01/31/2011 Document Revised: 10/27/2011 Document Reviewed: 01/31/2011 °ExitCare® Patient Information ©2015 [employer]. This information is not intended  to replace advice given to you by your health care provider. Make sure you discuss any questions you have with your health care provider. ° °Emergency Department Resource Guide °1) Find a Doctor and Pay Out of Pocket °Although you won't have to find out who is covered by your insurance plan, it is a good idea to ask around and get recommendations. You will then need to call the office and see if the doctor you have chosen will accept you as a new patient and what types of options they offer for patients who are self-pay. Some doctors offer discounts or will set up payment plans for their patients who do not have insurance, but you will need to ask so you aren't surprised when you get to your appointment. ° °2) Contact Your Local Health Department °Not all health departments have doctors that can see patients for sick visits, but many do, so it is worth a call to see if yours does. If you don't know where your local health department is, you can check in your phone book. The CDC also has a tool to help you locate your state's health department, and many state websites also have listings of all of their local health departments. ° °3) Find a Walk-in Clinic °If your illness is not likely to be very severe or complicated, you may want to try a walk in clinic. These are popping up all over the country in pharmacies, drugstores, and shopping centers. They're usually staffed by nurse practitioners or physician assistants that have been trained to treat common illnesses   and complaints. They're usually fairly quick and inexpensive. However, if you have serious medical issues or chronic medical problems, these are probably not your best option. ° °No Primary Care Doctor: °- Call Health Connect at  832-8000 - they can help you locate a primary care doctor that  accepts your insurance, provides certain services, etc. °- Physician Referral Service- 1-800-533-3463 ° °Chronic Pain Problems: °Organization         Address  Phone    Notes  °[facility]  (336) 297-2271 Patients need to be referred by their primary care doctor.  ° °Medication Assistance: °Organization         Address  Phone   Notes  °[place] [facility] [address] °[place], [place] [address] (336) 641-8030 --Must be a resident of [place] °-- Must have NO insurance coverage whatsoever (no Medicaid/ Medicare, etc.) °-- The pt. MUST have a primary care doctor that directs their care regularly and follows them in the community °  °[facility]  (866) 331-1348   °[employer]  (888) 892-1162   ° °Agencies that provide inexpensive medical care: °Organization         Address  Phone   Notes  °[facility]  (336) 832-8035   °[facility]    (336) 832-7272   °[facility] [address] °[place], [place] [address] (336) 832-4777   °[facility] [address], °[place] (336) 271-4999   °[facility]    (336) 373-0678   °[facility]    (336) 272-1050   °[facility] ° [address], [place] Phone:  (336) 832-4444, Fax:  (336) 832-4440 Hours of Operation:  9 am - 6 pm, M-F.  Also accepts Medicaid/Medicare and self-pay.  °[facility] ° [address], [place] Phone: (336) 832-3150, Fax: (336) 832-3151. Hours of Operation:  8:30 am - 5:30 pm, M-F.  Also accepts Medicaid and self-pay.  °[facility] [place] [address], [place] Phone: (336) 878-6027   °[facility] [address], [place], [place] (336)723-1848, Ext. 123 Mondays & Thursdays: 7-9 AM.  First 15 patients are seen on a first come, first serve basis. °  ° °Medicaid-accepting [place] Providers: ° °Organization         Address  Phone   Notes  °[facility] [address], [place] (336) 641-2100 Also accepts self-pay patients.  °[facility]  [address], [place] ° (336) 856-9996   °[facility] [address], [place] (336) 288-8857   °[facility] [address] [place] [address], [place] (336) 299-7000   °Veita Bland [address], [place]  ° (336) 373-1557 Only accepts  Access Medicaid patients after they have their name applied to their card.  ° °Self-Pay (no insurance) in [place]: ° °Organization         Address  Phone   Notes  °Sickle Cell Patients, [facility] [address], [place] (336) 832-1970   °[facility] [address], [place] (336) 832-4400   °[facility] ° [address], [place] (336) 992-4800   °[facility]/Dr. Osei-Bonsu ° [address] [place] [address], [place] or [address], [place] (336) 841-8500 Phone number for both [place] and [place] locations is the same.  °[facility] [address], [place] (336) 299-0000   °[facility] [address] [place] [address], [place] or [address] (336) 852-7530 °(336) 878-2260   °[facility] [address], [place] (336) 350-1642, phone; (336) 294-5005, fax Sees patients 1st and 3rd Saturday of every month.  Must not qualify for public or private insurance (i.e. Medicaid, Medicare, [place] Health Choice, Veterans' Benefits) • Household income should be no more than 200% of the poverty level •The clinic cannot treat you if you are pregnant or think you are pregnant • Sexually transmitted diseases are not treated at the clinic.  ° ° °Dental Care: °Organization         Address  Phone  Notes  °[facility] [address], [place] (336) 641-6152 Accepts children up to age 21 who are enrolled in Medicaid or [place] Health Choice; pregnant women with a Medicaid card; and children who have  applied for Medicaid or [place] Health Choice, but were declined, whose parents can pay a reduced fee at time of service.  °[facility] [place]  [address], [place] (336) 641-7733 Accepts children up to age 21 who are enrolled in Medicaid or [place] Health Choice; pregnant women with a Medicaid card; and children who have applied for Medicaid or [place] Health Choice, but were declined, whose parents can pay a reduced fee at time of service.  °[facility] ° [address], [place] (336) 641-4533 Patients are seen by appointment only. Walk-ins are not accepted. [facility] will see patients 18 years of age and older. °Monday - Tuesday (8am-5pm) °Most Wednesdays (8:30-5pm) °$30 per visit, cash only  °[facility] ° [address], [place] (336) 641-4533 Patients are seen by appointment only. Walk-ins are not accepted. [facility] will see patients 18 years of age and older. °One Wednesday Evening (Monthly: Volunteer Based).  $30 per visit, cash only  °[facility]  (919) 537-3737 for adults; Children under age 4, call Graduate Pediatric Dentistry at (919) [phone]. Children aged 4-14, please call (919) 537-3737 to request a pediatric application. ° Dental services are provided in all areas of dental care including fillings, crowns and bridges, complete and partial dentures, implants, gum treatment, root canals, and extractions. Preventive care is also provided. Treatment is provided to both adults and children. °Patients are selected via a lottery and there is often a waiting list. °  °[facility] [address], °[place] ° (336) 763-8833 [email or web address] °  °[facility] [address], [place], [place] (336)723-1848, Ext. 123 Second and Fourth Thursday of each month, opens at 6:30 AM; Clinic ends at 9 AM.  Patients are seen on a first-come first-served basis, and a  limited number are seen during each clinic.  ° °[facility] ° [address], [place],  (336) 723-7904   Eligibility Requirements °You must have lived in [place], [place], or [place] counties for at least the last three months. °  You cannot be eligible for state or federal sponsored healthcare insurance, including Veterans Administration, Medicaid, or Medicare. °  You generally cannot be eligible for healthcare insurance through your employer.  °  How to apply: °Eligibility screenings are held every Tuesday and Wednesday   afternoon from 1:00 pm until 4:00 pm. You do not need an appointment for the interview!  °[facility] [address], [place], [place] 336-631-2330   °[facility]  336-342-8273   °[facility]  336-703-3100   °[facility]  336-570-6415   ° °Behavioral Health Resources in the Community: °Intensive Outpatient Programs °Organization         Address  Phone  Notes  °[facility] [address], [place], [place] 336-878-6098   °[facility] [address], [place], [place] 336-832-9800   °[facility] [address], [place], [place] ° 336-882-2125   °[facility] [address],  °[place], [place] 1-800-853-5163 or 336-641-4981   °Substance Abuse Resources °Organization         Address  Phone  Notes  °[facility]  336-882-2125   °[facility]  336-784-9470   °[facility]  336-285-9073   °[facility]  336-845-3988   °Residential & Outpatient Substance Abuse Program  1-800-659-3381   °Psychological Services °Organization         Address  Phone  Notes  °[facility]  336- 832-9600   °[facility]  336- 378-7881   °[facility] [address], [place] 1-800-853-5163 or 336-641-4981   ° °Mobile Crisis Teams °Organization          Address  Phone  Notes  °[facility], Mobile Crisis Care Unit  1-877-626-1772   °Assertive °[facility] ° [address] [place], [place] 336-834-9664   °Sharon DeEsch [address] °[place] 336-554-5454   ° °Self-Help/Support Groups °Organization         Address  Phone             Notes  °[facility] - variety of support groups  336- 373-1402 Call for more information  °Narcotics Anonymous (NA), [facility] [address], °[place]  2 meetings at this location  ° °Residential Treatment Programs °Organization         Address  Phone  Notes  °[facility] [address],    °[place]  1-866-801-8205   °[facility] ° [address], [place], [place] 704-293-8524   °[facility] [address], [place] 336-845-3988 Admissions: 8am-3pm M-F  °[facility] [address]    °[place], [place] 336-841-1104   °[facility] [address], [place], [place] 336-379-7146   °[facility] [address]  °[place], [place] 336-285-9073   °[facility] - Intensive Outpatient [address], [place], [place] 336-852-3033   °[facility] [address]  °[place], [place] 1-877-615-2722 or 336-784-9470   °[facility] [address] [place], [place] 336-227-7417 Accepts Medicaid  °[facility] [address]  ° [place] 1-800-659-3381 Substance Abuse/Addiction Treatment  ° °[place] Behavioral Health Resources °Organization         Address  Phone  Notes  °[facility]  (888) 581-9988   °Julie Brannon, PhD [address] [place], [place]   (336) 349-5553 or (336) 951-0000   °[facility]   [address] °[place], [place] (336) 349-4454   °[facility] [address], [place],  (336) 342-8316 Insurance/Medicaid/sponsorship  through [facility]  °[facility] [address]                                    [place], [place] (336) 342-8316 Therapy/tele-psych/case  °[facility] [address]  ° [place], [place] (336) 349-2233    °Dr. Arfeen  (336) 349-4544   °[facility] [place]  [employer] [place] [facility] 1) [address], [place] °2) [address], [place] °3)  [address], [place] (336) 349-3220 °(336) 342-7768 ° °(336) 342-8140   °[place] [facility] (336) 342-1394 or (336) 342-3537 (After Hours)    ° ° °

## 2014-07-25 NOTE — Progress Notes (Signed)
  CARE MANAGEMENT ED NOTE 07/25/2014  Patient:  Yesenia Howard,Yesenia Howard   Account Number:  [id number]  Date Initiated:  07/25/2014  Documentation initiated by:  Livia Snellen  Subjective/Objective Assessment:   Patient presents to Ed with shortness of breath, cough and wheezing     Subjective/Objective Assessment Detail:     Action/Plan:   Action/Plan Detail:   Anticipated DC Date:       Status Recommendation to Physician:   Result of Recommendation:    Other ED [place]  Other  PCP issues    Choice offered to / List presented to:            Status of service:  Completed, signed off  ED Comments:   ED Comments Detail:  EDCM spoke to patient at bedside.  Patient listed as having Medicaid insurnace without pcp living in [place]. Patient reports she was diagnosed with pneumonia one month ago and hospitalized at [facility] for one day. Patient reports she was seen by Dr Delight Stare at the [facility] in [facility] two weeks ago and is planning on keeping her as her pcp.  Patient reports she has been having difficulty affording medications prescribed to her as Mediciaid has not been covering, may not be on Medicaid preferred medication list.

## 2014-07-26 ENCOUNTER — Telehealth: Payer: Self-pay | Admitting: *Deleted

## 2014-07-26 NOTE — ED Notes (Unsigned)
Prescription called [facility] [place] per Dr. Mingo Amber request:  Prednisone 20 mg tabs, take 60mg  qd x 5 days, #15, no refills.

## 2014-07-26 NOTE — Progress Notes (Signed)
07/26/2014 A. Salmaan Patchin RNCM 1646pm EDCM called patient for follow up without success.  Phone kept ringing without answering machine.

## 2014-07-28 ENCOUNTER — Encounter (HOSPITAL_COMMUNITY): Payer: Self-pay | Admitting: Emergency Medicine

## 2014-07-28 ENCOUNTER — Emergency Department (HOSPITAL_COMMUNITY): Payer: Medicaid Other

## 2014-07-28 ENCOUNTER — Inpatient Hospital Stay (HOSPITAL_COMMUNITY)
Admission: EM | Admit: 2014-07-28 | Discharge: 2014-07-31 | DRG: 203 | Disposition: A | Discharge status: Home / Self Care | Payer: Medicaid Other | Attending: Internal Medicine | Admitting: Internal Medicine

## 2014-07-28 DIAGNOSIS — Z7982 Long term (current) use of aspirin: Secondary | ICD-10-CM

## 2014-07-28 DIAGNOSIS — J40 Bronchitis, not specified as acute or chronic: Secondary | ICD-10-CM | POA: Diagnosis present

## 2014-07-28 DIAGNOSIS — J209 Acute bronchitis, unspecified: Principal | ICD-10-CM | POA: Diagnosis present

## 2014-07-28 DIAGNOSIS — R030 Elevated blood-pressure reading, without diagnosis of hypertension: Secondary | ICD-10-CM | POA: Diagnosis present

## 2014-07-28 DIAGNOSIS — R0609 Other forms of dyspnea: Secondary | ICD-10-CM | POA: Diagnosis present

## 2014-07-28 DIAGNOSIS — R0602 Shortness of breath: Secondary | ICD-10-CM

## 2014-07-28 DIAGNOSIS — N8320 Unspecified ovarian cysts: Secondary | ICD-10-CM | POA: Diagnosis present

## 2014-07-28 DIAGNOSIS — E119 Type 2 diabetes mellitus without complications: Secondary | ICD-10-CM

## 2014-07-28 LAB — BASIC METABOLIC PANEL
Anion gap: 12 (ref 5–15)
BUN: 12 mg/dL (ref 6–23)
CO2: 27 mEq/L (ref 19–32)
Calcium: 9.9 mg/dL (ref 8.4–10.5)
Chloride: 103 mEq/L (ref 96–112)
Creatinine, Ser: 0.59 mg/dL (ref 0.50–1.10)
GLUCOSE: 145 mg/dL — AB (ref 70–99)
Potassium: 4.3 mEq/L (ref 3.7–5.3)
Sodium: 142 mEq/L (ref 137–147)

## 2014-07-28 LAB — CBC WITH DIFFERENTIAL/PLATELET
BASOS PCT: 0 % (ref 0–1)
Basophils Absolute: 0 10*3/uL (ref 0.0–0.1)
EOS PCT: 7 % — AB (ref 0–5)
Eosinophils Absolute: 0.4 10*3/uL (ref 0.0–0.7)
HCT: 33.7 % — ABNORMAL LOW (ref 36.0–46.0)
Hemoglobin: 11.1 g/dL — ABNORMAL LOW (ref 12.0–15.0)
LYMPHS ABS: 1.3 10*3/uL (ref 0.7–4.0)
Lymphocytes Relative: 21 % (ref 12–46)
MCH: 29.4 pg (ref 26.0–34.0)
MCHC: 32.9 g/dL (ref 30.0–36.0)
MCV: 89.2 fL (ref 78.0–100.0)
Monocytes Absolute: 0.4 10*3/uL (ref 0.1–1.0)
Monocytes Relative: 7 % (ref 3–12)
Neutro Abs: 4 10*3/uL (ref 1.7–7.7)
Neutrophils Relative %: 65 % (ref 43–77)
Platelets: 230 10*3/uL (ref 150–400)
RBC: 3.78 MIL/uL — AB (ref 3.87–5.11)
RDW: 14.1 % (ref 11.5–15.5)
WBC: 6.1 10*3/uL (ref 4.0–10.5)

## 2014-07-28 LAB — CBG MONITORING, ED: Glucose-Capillary: 135 mg/dL — ABNORMAL HIGH (ref 70–99)

## 2014-07-28 MED ORDER — METHYLPREDNISOLONE SODIUM SUCC 125 MG IJ SOLR
125.0000 mg | Freq: Once | INTRAMUSCULAR | Status: AC
  Administered 2014-07-28: 125 mg via INTRAVENOUS
  Filled 2014-07-28: qty 2

## 2014-07-28 MED ORDER — IPRATROPIUM-ALBUTEROL 0.5-2.5 (3) MG/3ML IN SOLN
3.0000 mL | RESPIRATORY_TRACT | Status: DC
  Administered 2014-07-29 – 2014-07-30 (×10): 3 mL via RESPIRATORY_TRACT
  Filled 2014-07-28 (×9): qty 3

## 2014-07-28 MED ORDER — ACETAMINOPHEN 325 MG PO TABS: 650.0000 mg | ORAL_TABLET | Freq: Four times a day (QID) | ORAL | Status: DC | PRN

## 2014-07-28 MED ORDER — SODIUM CHLORIDE 0.9 % IV SOLN: 250.0000 mL | INTRAVENOUS | Status: DC | PRN

## 2014-07-28 MED ORDER — ONDANSETRON HCL 4 MG PO TABS: 4.0000 mg | ORAL_TABLET | Freq: Four times a day (QID) | ORAL | Status: DC | PRN

## 2014-07-28 MED ORDER — ALUM & MAG HYDROXIDE-SIMETH 200-200-20 MG/5ML PO SUSP: 30.0000 mL | Freq: Four times a day (QID) | ORAL | Status: DC | PRN

## 2014-07-28 MED ORDER — BENZONATATE 100 MG PO CAPS
200.0000 mg | ORAL_CAPSULE | Freq: Once | ORAL | Status: AC
  Administered 2014-07-28: 200 mg via ORAL
  Filled 2014-07-28: qty 2

## 2014-07-28 MED ORDER — FLUTICASONE PROPIONATE 50 MCG/ACT NA SUSP
1.0000 | Freq: Every day | NASAL | Status: DC
  Administered 2014-07-29 – 2014-07-31 (×3): 1 via NASAL
  Filled 2014-07-28: qty 16

## 2014-07-28 MED ORDER — ALBUTEROL (5 MG/ML) CONTINUOUS INHALATION SOLN
10.0000 mg | INHALATION_SOLUTION | RESPIRATORY_TRACT | Status: AC
  Administered 2014-07-28: 10 mg via RESPIRATORY_TRACT
  Filled 2014-07-28: qty 20

## 2014-07-28 MED ORDER — METHYLPREDNISOLONE SODIUM SUCC 125 MG IJ SOLR: 125.0000 mg | Freq: Once | INTRAMUSCULAR | Status: DC

## 2014-07-28 MED ORDER — ONDANSETRON HCL 4 MG/2ML IJ SOLN: 4.0000 mg | Freq: Four times a day (QID) | INTRAMUSCULAR | Status: DC | PRN

## 2014-07-28 MED ORDER — LEVOFLOXACIN 500 MG PO TABS
500.0000 mg | ORAL_TABLET | Freq: Every day | ORAL | Status: DC
  Filled 2014-07-28: qty 1

## 2014-07-28 MED ORDER — HYDROCOD POLST-CHLORPHEN POLST 10-8 MG/5ML PO LQCR
5.0000 mL | Freq: Two times a day (BID) | ORAL | Status: DC | PRN
  Administered 2014-07-29 – 2014-07-30 (×3): 5 mL via ORAL
  Filled 2014-07-28 (×3): qty 5

## 2014-07-28 MED ORDER — OXYCODONE HCL 5 MG PO TABS: 5.0000 mg | ORAL_TABLET | ORAL | Status: DC | PRN

## 2014-07-28 MED ORDER — HYDROMORPHONE HCL 1 MG/ML IJ SOLN: 0.5000 mg | INTRAMUSCULAR | Status: DC | PRN

## 2014-07-28 MED ORDER — SODIUM CHLORIDE 0.9 % IV SOLN
INTRAVENOUS | Status: DC
  Administered 2014-07-28: 20:00:00 via INTRAVENOUS
  Administered 2014-07-28: 10 mL/h via INTRAVENOUS

## 2014-07-28 MED ORDER — ACETAMINOPHEN 650 MG RE SUPP: 650.0000 mg | Freq: Four times a day (QID) | RECTAL | Status: DC | PRN

## 2014-07-28 MED ORDER — SODIUM CHLORIDE 0.9 % IJ SOLN
3.0000 mL | INTRAMUSCULAR | Status: DC | PRN
  Administered 2014-07-28: 3 mL via INTRAVENOUS
  Filled 2014-07-28: qty 3

## 2014-07-28 MED ORDER — SODIUM CHLORIDE 0.9 % IJ SOLN
3.0000 mL | Freq: Two times a day (BID) | INTRAMUSCULAR | Status: DC
  Administered 2014-07-31: 3 mL via INTRAVENOUS

## 2014-07-28 MED ORDER — ENOXAPARIN SODIUM 60 MG/0.6ML ~~LOC~~ SOLN
60.0000 mg | Freq: Every day | SUBCUTANEOUS | Status: DC
  Administered 2014-07-29: 60 mg via SUBCUTANEOUS
  Filled 2014-07-28 (×2): qty 0.6

## 2014-07-28 MED ORDER — SODIUM CHLORIDE 0.9 % IJ SOLN: 3.0000 mL | Freq: Two times a day (BID) | INTRAMUSCULAR | Status: DC

## 2014-07-28 MED ORDER — IPRATROPIUM BROMIDE 0.02 % IN SOLN
0.5000 mg | Freq: Once | RESPIRATORY_TRACT | Status: AC
  Administered 2014-07-28: 0.5 mg via RESPIRATORY_TRACT
  Filled 2014-07-28: qty 2.5

## 2014-07-28 MED ORDER — LEVOFLOXACIN 750 MG PO TABS
750.0000 mg | ORAL_TABLET | Freq: Once | ORAL | Status: AC
  Administered 2014-07-29: 750 mg via ORAL
  Filled 2014-07-28: qty 1

## 2014-07-28 NOTE — ED Provider Notes (Signed)
CSN: [id number]     Arrival date & time 07/28/14  1731 History   First MD Initiated Contact with Patient 07/28/14 1844     Chief Complaint  Patient presents with  . Cough  . Shortness of Breath   Yesenia Howard is a 58 y.o. female with a history of diabetes and pneumonia who presents to the emergency department complaining of worsening bronchitis with associated cough and shortness of breath for the past few weeks. Patient reports that she was admitted to the hospital for pneumonia approximately 2-1/2 weeks ago at [facility]. Patient reports that since then she has had a cough, wheezing and some shortness of breath. Patient reports that she feels like she's been coughing more, her cough suppressant is not working and she's been having trouble sleeping at night. Patient also reports feeling short of breath like her chest is tight with wheezing. Patient was seen in the ED on 07/24/2014 and 07/25/2014 for bronchitis. Patient was last given Vicodin, albuterol and azithromycin which she reports she has been taking without improvement. Patient reports that she's tried Mucinex DM, and taken azithromycin without relief today. Patient brought she is used albuterol treatments 3 times today with minimal relief. Patient reports she has diabetes but was taken off her metformin during her hospital admission to half weeks ago due to her sugars being low normal. The patient reports a good appetite and has been eating and drinking normally.  Patient denies fevers, chills, sore throat, nasal congestion, ear pain, abdominal pain, nausea, vomiting, chest pain, palpitations, weakness, fatigue, changes to her appetite.  (Consider location/radiation/quality/duration/timing/severity/associated sxs/prior Treatment) HPI  Past Medical History  Diagnosis Date  . Ovarian cyst   . Retinal detachment   . Cataract   . Diabetes mellitus without complication    Past Surgical History  Procedure Laterality Date  .  Eye surgery    . Cesarean section    . Ovarian cyst removal    . Cataract extraction     History reviewed. No pertinent family history. History  Substance Use Topics  . Smoking status: Never Smoker   . Smokeless tobacco: Not on file  . Alcohol Use: No   OB History    No data available     Review of Systems  Constitutional: Negative for fever, chills and appetite change.  HENT: Positive for congestion. Negative for ear pain, sore throat and trouble swallowing.   Eyes: Negative for pain and visual disturbance.  Respiratory: Positive for cough, chest tightness, shortness of breath and wheezing.   Cardiovascular: Negative for chest pain and palpitations.  Gastrointestinal: Negative for nausea, vomiting, abdominal pain and diarrhea.  Genitourinary: Negative for dysuria, frequency, hematuria, flank pain and difficulty urinating.  Musculoskeletal: Negative for myalgias, back pain and neck pain.  Skin: Negative for pallor, rash and wound.  Neurological: Negative for dizziness, light-headedness, numbness and headaches.  All other systems reviewed and are negative.     Allergies  Review of patient's allergies indicates no known allergies.  Home Medications   Prior to Admission medications   Medication Sig Start Date End Date Taking? Authorizing Provider  albuterol (PROVENTIL HFA;VENTOLIN HFA) 108 (90 BASE) MCG/ACT inhaler Inhale 1-2 puffs into the lungs every 6 (six) hours as needed for wheezing or shortness of breath. 07/25/14  Yes Evelina Bucy, MD  albuterol (PROVENTIL) (2.5 MG/3ML) 0.083% nebulizer solution Take 2.5 mg by nebulization every 6 (six) hours as needed for wheezing or shortness of breath (wheezing).   Yes Historical Provider, MD  aspirin-acetaminophen-caffeine (  EXCEDRIN MIGRAINE) 250-250-65 MG per tablet Take 1 tablet by mouth every 6 (six) hours as needed for headache.   Yes Historical Provider, MD  azithromycin (ZITHROMAX) 250 MG tablet Take 1 tablet (250 mg total) by  mouth daily. Take first 2 tablets together, then 1 every day until finished. 07/25/14  Yes Evelina Bucy, MD  benzonatate (TESSALON) 100 MG capsule Take 100 mg by mouth every 6 (six) hours as needed for cough (cough).    Yes Historical Provider, MD  dextromethorphan-guaiFENesin (MUCINEX DM) 30-600 MG per 12 hr tablet Take 2 tablets by mouth 2 (two) times daily.   Yes Historical Provider, MD  guaiFENesin-codeine (CHERATUSSIN AC) 100-10 MG/5ML syrup Take 5 mLs by mouth 3 (three) times daily as needed for cough (cough).    Yes Historical Provider, MD  HYDROcodone-acetaminophen (NORCO/VICODIN) 5-325 MG per tablet Take 1 tablet by mouth every 6 (six) hours as needed for moderate pain. 07/25/14  Yes Evelina Bucy, MD  mometasone (NASONEX) 50 MCG/ACT nasal spray Place 1 spray into the nose daily.   Yes Historical Provider, MD  albuterol (PROVENTIL HFA;VENTOLIN HFA) 108 (90 BASE) MCG/ACT inhaler Inhale 2 puffs into the lungs every 4 (four) hours as needed for wheezing or shortness of breath. 07/24/14   Britt Bottom, NP   BP 157/79 mmHg  Pulse 110  Temp(Src) 98.3 F (36.8 C) (Oral)  Resp 22  SpO2 95% Physical Exam  Constitutional: She appears well-developed and well-nourished. No distress.  Patient speaking in full sentences.   HENT:  Head: Normocephalic and atraumatic.  Right Ear: External ear normal.  Left Ear: External ear normal.  Nose: Nose normal.  Mouth/Throat: Oropharynx is clear and moist. No oropharyngeal exudate.  Eyes: Conjunctivae are normal. Pupils are equal, round, and reactive to light. Right eye exhibits no discharge. Left eye exhibits no discharge.  Neck: Normal range of motion. Neck supple.  Cardiovascular: Regular rhythm, normal heart sounds and intact distal pulses.  Exam reveals no gallop and no friction rub.   No murmur heard. Tachycardic at 108  Pulmonary/Chest: Effort normal. No respiratory distress. She has wheezes. She has no rales.  Bilateral inspiratory and  expiratory wheezes.   Abdominal: Soft. She exhibits no distension. There is no tenderness.  Musculoskeletal: She exhibits no edema.  Lymphadenopathy:    She has no cervical adenopathy.  Neurological: She is alert. Coordination normal.  Skin: Skin is warm. No rash noted. She is diaphoretic. No erythema. No pallor.  Psychiatric: She has a normal mood and affect. Her behavior is normal.  Nursing note and vitals reviewed.   ED Course  Procedures (including critical care time) Labs Review Labs Reviewed  CBC WITH DIFFERENTIAL - Abnormal; Notable for the following:    RBC 3.78 (*)    Hemoglobin 11.1 (*)    HCT 33.7 (*)    Eosinophils Relative 7 (*)    All other components within normal limits  BASIC METABOLIC PANEL - Abnormal; Notable for the following:    Glucose, Bld 145 (*)    All other components within normal limits  CBG MONITORING, ED - Abnormal; Notable for the following:    Glucose-Capillary 135 (*)    All other components within normal limits    Imaging Review Dg Chest 2 View (if Patient Has Fever And/or Copd)  07/28/2014   CLINICAL DATA:  58 year old female with a several week history of cough and shortness of breath  EXAM: CHEST  2 VIEW  COMPARISON:  Prior chest x-ray 07/25/2014  FINDINGS: Cardiac  and mediastinal contours remain within normal limits. Progressed central airway thickening and peribronchial cuffing compared to 07/25/2014. No focal airspace consolidation, pleural effusion, pneumothorax or pulmonary edema. No acute osseous abnormality.  IMPRESSION: Progression of central airway thickening and peribronchial cuffing compared to 07/25/2014. Differential considerations include acute bronchitis, viral respiratory infection and exacerbation of a chronic inflammatory process such as asthma.   Electronically Signed   By: Jacqulynn Cadet M.D.   On: 07/28/2014 19:32     EKG Interpretation None      Filed Vitals:   07/28/14 1759 07/28/14 1936 07/28/14 2146  BP: 160/85   157/79  Pulse: 108  110  Temp: 98.3 F (36.8 C)    TempSrc: Oral    Resp: 20  22  SpO2: 95% 93% 95%     MDM   Meds given in ED:  Medications  albuterol (PROVENTIL,VENTOLIN) solution continuous neb (10 mg Nebulization New Bag/Given 07/28/14 1936)  0.9 %  sodium chloride infusion ( Intravenous New Bag/Given 07/28/14 2010)  ipratropium (ATROVENT) nebulizer solution 0.5 mg (0.5 mg Nebulization Given 07/28/14 1936)  benzonatate (TESSALON) capsule 200 mg (200 mg Oral Given 07/28/14 2010)  methylPREDNISolone sodium succinate (SOLU-MEDROL) 125 mg/2 mL injection 125 mg (125 mg Intravenous Given 07/28/14 2108)    New Prescriptions   No medications on file    Final diagnoses:  Bronchitis   Yesenia Howard is a 58 y.o. female with a history of diabetes and pneumonia who presents to the emergency department complaining of worsening bronchitis with associated cough and shortness of breath for the past few weeks. Patient was admitted for pneumonia 2 and half weeks ago at [facility]. Patient reports her cough and wheezing never fully resolved. She was seen in the ED on 07/24/14 and 07/25/14 and reports her cough and wheezing has just gotten worse. Patient still feels short of breath after using albuterol at home.    21:45 After patient got continuous nebulizer with 10mg  albuterol and 0.5 mg Atrovent and solumedrol IV the patient walked to door of room on room air and oxygen saturation dropped to 87%. Dr. Zenia Resides will consult for admission for worsening bronchitis.  Dr. Arnoldo Morale accepted the patient for admission.   This patient was discussed with and evaluated by Dr. Zenia Resides who agrees with assessment and plan.    Hanley Hays, PA-C 07/29/14 [phone]

## 2014-07-28 NOTE — ED Notes (Signed)
RT notified of pt con neb tx

## 2014-07-28 NOTE — Progress Notes (Signed)
  CARE MANAGEMENT ED NOTE 07/28/2014  Patient:  Yesenia Howard,Yesenia Howard   Account Number:  [id number]  Date Initiated:  07/28/2014  Documentation initiated by:  Livia Snellen  Subjective/Objective Assessment:   Patient presents to Ed with increased shortness of breath and wheezing.     Subjective/Objective Assessment Detail:   Patient has been in the ED three times within the last six months.     Action/Plan:   Action/Plan Detail:   Anticipated DC Date:       Status Recommendation to Physician:   Result of Recommendation:    Other ED [place]  Other  PCP issues    Choice offered to / List presented to:            Status of service:  Completed, signed off  ED Comments:   ED Comments Detail:  EDCM spoke to patient at bedside.  [facility] informed patient that [facility] was unable to contact her for possible medication assistance.  Patient stated, "I am so sorry.  you were probably calling my home number but I am here staying with family."  Patient reports her cell phone number is [phone].  Patient reports she was able to pay for all of her medications she was discharged on including the ventolin inhaler which is not a Medicaid preferred drug. Patient confirms her pcp is Dr. Delight Stare of the [facility] clinic in [place], even though [facility] could not locate this provider for the [place] clinic.  Patient thankful for services. No further EDCM needs at this time.

## 2014-07-28 NOTE — ED Provider Notes (Signed)
Medical screening examination/treatment/procedure(s) were conducted as a shared visit with non-physician practitioner(s) and myself.  I personally evaluated the patient during the encounter.   EKG Interpretation None     Patient here complaining of increased redness of breath with wheezing. Seen by her for similar symptoms diagnosed with bronchitis. On exam she has diffuse wheezing throughout. Chest x-ray negative for pneumonia. Will give patient continuous epidural treatment here along with prednisone and monitor and patient may require inpatient mission  Leota Jacobsen, MD 07/28/14 [phone]

## 2014-07-28 NOTE — ED Notes (Addendum)
Pt reports cough and sob for past few weeks. Was seen on 12/8 for same; was given breathing tx in ED and prescriptions. Pt states she does not feel any better. Feels sob, some wheezing, able to speak in complete sentences. Pt reports chest tightness same since last visit.

## 2014-07-28 NOTE — ED Notes (Signed)
Pt sitting high fowlers in bed. Family at bedside. RT in to give pt breathing treatment. Cough present and pt speaks in broken sentences. Pt alert and oriented. No significant distress noted.

## 2014-07-28 NOTE — H&P (Signed)
[facility] Admission History and Physical       Bernadetta Roell XAJ:[id number] DOB: 1956-08-08 DOA: 07/28/2014  Referring physician: EDP PCP: No primary care provider on file.  Specialists:   Chief Complaint: SOB  HPI: Yesenia Howard is a 58 y.o. female with a history of DM2 and reported history of Pneumonia with hospitalization in mid -November who presents to the ED with complaints of worsening SOB and Productive cough along with Chest congestion and wheezing.  She reports having fevers and chills off and on.  She was seen following her hospitalization  In the ED and was given a Z-pack and Prednisone pack for Acute bronchitis but her symptoms have continue d to worsen.  She report coughing up yellow sputum.    She was seen in the ED and had decreased O2 saturation to 87% and was treated with nebulizer treatments and IV solumedrol with minimal improvement and was referred for medical admission.   Her chest X-ray was negative for pneumonia but revealed per-bronchial thickening.      Review of Systems:  Constitutional: No Weight Loss, No Weight Gain, Night Sweats, Fevers, Chills, Dizziness, Fatigue, or Generalized Weakness HEENT: No Headaches, Difficulty Swallowing,Tooth/Dental Problems,Sore Throat,  No Sneezing, Rhinitis, Ear Ache, Nasal Congestion, or Post Nasal Drip,  Cardio-vascular:  No Chest pain, Orthopnea, PND, Edema in Lower Extremities, Anasarca, Dizziness, Palpitations  Resp: No +Dyspnea, No DOE,+Productive Cough, No Hemoptysis, +Wheezing.    GI: No Heartburn, Indigestion, Abdominal Pain, Nausea, Vomiting, Diarrhea, Hematemesis, Hematochezia, Melena, Change in Bowel Habits,  Loss of Appetite  GU: No Dysuria, Change in Color of Urine, No Urgency or Frequency, No Flank pain.  Musculoskeletal: No Joint Pain or Swelling, No Decreased Range of Motion, No Back Pain.  Neurologic: No Syncope, No Seizures, Muscle Weakness, Paresthesia, Vision Disturbance or Loss, No  Diplopia, No Vertigo, No Difficulty Walking,  Skin: No Rash or Lesions. Psych: No Change in Mood or Affect, No Depression or Anxiety, No Memory loss, No Confusion, or Hallucinations   Past Medical History  Diagnosis Date  . Ovarian cyst   . Retinal detachment   . Cataract   . Diabetes mellitus without complication      Past Surgical History  Procedure Laterality Date  . Eye surgery    . Cesarean section    . Ovarian cyst removal    . Cataract extraction        Prior to Admission medications   Medication Sig Start Date End Date Taking? Authorizing Provider  albuterol (PROVENTIL HFA;VENTOLIN HFA) 108 (90 BASE) MCG/ACT inhaler Inhale 1-2 puffs into the lungs every 6 (six) hours as needed for wheezing or shortness of breath. 07/25/14  Yes Evelina Bucy, MD  albuterol (PROVENTIL) (2.5 MG/3ML) 0.083% nebulizer solution Take 2.5 mg by nebulization every 6 (six) hours as needed for wheezing or shortness of breath (wheezing).   Yes Historical Provider, MD  aspirin-acetaminophen-caffeine (EXCEDRIN MIGRAINE) [id number] MG per tablet Take 1 tablet by mouth every 6 (six) hours as needed for headache.   Yes Historical Provider, MD  azithromycin (ZITHROMAX) 250 MG tablet Take 1 tablet (250 mg total) by mouth daily. Take first 2 tablets together, then 1 every day until finished. 07/25/14  Yes Evelina Bucy, MD  benzonatate (TESSALON) 100 MG capsule Take 100 mg by mouth every 6 (six) hours as needed for cough (cough).    Yes Historical Provider, MD  dextromethorphan-guaiFENesin (MUCINEX DM) 30-600 MG per 12 hr tablet Take 2 tablets by mouth 2 (two) times daily.  Yes Historical Provider, MD  guaiFENesin-codeine (CHERATUSSIN AC) 100-10 MG/5ML syrup Take 5 mLs by mouth 3 (three) times daily as needed for cough (cough).    Yes Historical Provider, MD  HYDROcodone-acetaminophen (NORCO/VICODIN) 5-325 MG per tablet Take 1 tablet by mouth every 6 (six) hours as needed for moderate pain. 07/25/14  Yes Evelina Bucy, MD  mometasone (NASONEX) 50 MCG/ACT nasal spray Place 1 spray into the nose daily.   Yes Historical Provider, MD  albuterol (PROVENTIL HFA;VENTOLIN HFA) 108 (90 BASE) MCG/ACT inhaler Inhale 2 puffs into the lungs every 4 (four) hours as needed for wheezing or shortness of breath. 07/24/14   Britt Bottom, NP      No Known Allergies   Social History:  reports that she has never smoked. She does not have any smokeless tobacco history on file. She reports that she does not drink alcohol or use illicit drugs.     History reviewed. No pertinent family history.     Physical Exam:  GEN:  Pleasant Obese  58 y.o. African American  female  examined and in no acute distress; cooperative with exam Filed Vitals:   07/28/14 1759 07/28/14 1936 07/28/14 2146  BP: 160/85  157/79  Pulse: 108  110  Temp: 98.3 F (36.8 C)    TempSrc: Oral    Resp: 20  22  SpO2: 95% 93% 95%   Blood pressure 157/79, pulse 110, temperature 98.3 F (36.8 C), temperature source Oral, resp. rate 22, SpO2 95 %. PSYCH: She is alert and oriented x4; does not appear anxious does not appear depressed; affect is normal HEENT: Normocephalic and Atraumatic, Mucous membranes pink; PERRLA; EOM intact; Fundi:  Benign;  No scleral icterus, Nares: Patent, Oropharynx: Clear, Poor Dentition,    Neck:  FROM, No Cervical Lymphadenopathy nor Thyromegaly or Carotid Bruit; No JVD; Breasts:: Not examined CHEST WALL: No tenderness CHEST: Decreased Breath sounds, Diffuse Rhonchi, and +Expiratory wheezes,   No Rales.    HEART: Regular rate and rhythm; no murmurs rubs or gallops BACK: No kyphosis or scoliosis; No CVA tenderness ABDOMEN: Positive Bowel Sounds, Obese, Soft Non-Tender; No Masses, No Organomegaly.   Rectal Exam: Not done EXTREMITIES: No Cyanosis, Clubbing,  +Trace Edema; No Ulcerations. Genitalia: not examined PULSES: 2+ and symmetric SKIN: Normal hydration no rash or ulceration CNS:  Alert and Oriented x 4, No  Focal Deficits Vascular: pulses palpable throughout    Labs on Admission:  Basic Metabolic Panel:  Recent Labs Lab 07/25/14 1914 07/28/14 2015  NA 139 142  K 4.0 4.3  CL 102 103  CO2 21 27  GLUCOSE 179* 145*  BUN 13 12  CREATININE 0.64 0.59  CALCIUM 9.9 9.9   Liver Function Tests: No results for input(s): AST, ALT, ALKPHOS, BILITOT, PROT, ALBUMIN in the last 168 hours. No results for input(s): LIPASE, AMYLASE in the last 168 hours. No results for input(s): AMMONIA in the last 168 hours. CBC:  Recent Labs Lab 07/25/14 1914 07/28/14 2015  WBC 8.3 6.1  NEUTROABS  --  4.0  HGB 11.5* 11.1*  HCT 36.2 33.7*  MCV 89.4 89.2  PLT 222 230   Cardiac Enzymes: No results for input(s): CKTOTAL, CKMB, CKMBINDEX, TROPONINI in the last 168 hours.  BNP (last 3 results)  Recent Labs  07/25/14 1914  PROBNP 92.3   CBG:  Recent Labs Lab 07/28/14 1924  GLUCAP 135*    Radiological Exams on Admission: Dg Chest 2 View (if Patient Has Fever And/or Copd)  07/28/2014  CLINICAL DATA:  58 year old female with a several week history of cough and shortness of breath  EXAM: CHEST  2 VIEW  COMPARISON:  Prior chest x-ray 07/25/2014  FINDINGS: Cardiac and mediastinal contours remain within normal limits. Progressed central airway thickening and peribronchial cuffing compared to 07/25/2014. No focal airspace consolidation, pleural effusion, pneumothorax or pulmonary edema. No acute osseous abnormality.  IMPRESSION: Progression of central airway thickening and peribronchial cuffing compared to 07/25/2014. Differential considerations include acute bronchitis, viral respiratory infection and exacerbation of a chronic inflammatory process such as asthma.   Electronically Signed   By: Jacqulynn Cadet M.D.   On: 07/28/2014 19:32       Assessment/Plan:    58 y.o. female with  Principal Problem:   1.    SOB (shortness of breath)- due to Acute Bronchitis   Monitor o fTelemetry Unit   Monitor  O2 sats   O2 PRN     Active Problems:   2.   Bronchitis- Acute   IV Steroid Taper   DuoNebs   Levaquin Empirically   Tussionex PRN cough       3.   Diabetes mellitus without complication   SSI coveragfe PRN   Check HbA1C.       4.   Elevated blood-pressure reading without diagnosis of hypertension   Monitor BPs   PRN IV Hydralazine    5.    DVT Prophylaxis   Lopvenox    Code Status:    FULL CODE Family Communication:    Family at Bedside   Disposition Plan:       Inpatient Telemetry Unit  Time spent:  60 MInutes  [facility] Pager [phone]   If [place] Please Contact the Day Rounding Team MD for [facility]  If 7PM-7AM, Please Contact Night-Floor Coverage  [email or web address] Password [email or web address] 07/28/2014, 11:32 PM      ADDENDUM: Patient was seen and examined on 07/28/2014

## 2014-07-28 NOTE — ED Notes (Signed)
Pt's O2 sat's dropped to 87%from the bed to the door way. Pt was very short of breath and weak.

## 2014-07-29 ENCOUNTER — Encounter (HOSPITAL_COMMUNITY): Payer: Self-pay | Admitting: *Deleted

## 2014-07-29 DIAGNOSIS — R0602 Shortness of breath: Secondary | ICD-10-CM

## 2014-07-29 DIAGNOSIS — R030 Elevated blood-pressure reading, without diagnosis of hypertension: Secondary | ICD-10-CM | POA: Diagnosis present

## 2014-07-29 DIAGNOSIS — E119 Type 2 diabetes mellitus without complications: Secondary | ICD-10-CM

## 2014-07-29 LAB — CBC
HCT: 34.1 % — ABNORMAL LOW (ref 36.0–46.0)
Hemoglobin: 11 g/dL — ABNORMAL LOW (ref 12.0–15.0)
MCH: 29.1 pg (ref 26.0–34.0)
MCHC: 32.3 g/dL (ref 30.0–36.0)
MCV: 90.2 fL (ref 78.0–100.0)
PLATELETS: 213 10*3/uL (ref 150–400)
RBC: 3.78 MIL/uL — AB (ref 3.87–5.11)
RDW: 14.1 % (ref 11.5–15.5)
WBC: 5.3 10*3/uL (ref 4.0–10.5)

## 2014-07-29 LAB — D-DIMER, QUANTITATIVE (NOT AT ARMC): D DIMER QUANT: 0.4 ug{FEU}/mL (ref 0.00–0.48)

## 2014-07-29 LAB — GLUCOSE, CAPILLARY
GLUCOSE-CAPILLARY: 179 mg/dL — AB (ref 70–99)
Glucose-Capillary: 159 mg/dL — ABNORMAL HIGH (ref 70–99)
Glucose-Capillary: 146 mg/dL — ABNORMAL HIGH (ref 70–99)

## 2014-07-29 LAB — BASIC METABOLIC PANEL
ANION GAP: 10 (ref 5–15)
BUN: 13 mg/dL (ref 6–23)
CHLORIDE: 103 meq/L (ref 96–112)
CO2: 26 mEq/L (ref 19–32)
Calcium: 9.7 mg/dL (ref 8.4–10.5)
Creatinine, Ser: 0.62 mg/dL (ref 0.50–1.10)
Glucose, Bld: 211 mg/dL — ABNORMAL HIGH (ref 70–99)
POTASSIUM: 4.8 meq/L (ref 3.7–5.3)
SODIUM: 139 meq/L (ref 137–147)

## 2014-07-29 MED ORDER — PANTOPRAZOLE SODIUM 40 MG PO TBEC
40.0000 mg | DELAYED_RELEASE_TABLET | Freq: Every day | ORAL | Status: DC
  Administered 2014-07-29 – 2014-07-31 (×3): 40 mg via ORAL
  Filled 2014-07-29 (×3): qty 1

## 2014-07-29 MED ORDER — METHYLPREDNISOLONE SODIUM SUCC 40 MG IJ SOLR
40.0000 mg | Freq: Four times a day (QID) | INTRAMUSCULAR | Status: DC
  Administered 2014-07-29 – 2014-07-30 (×5): 40 mg via INTRAVENOUS
  Filled 2014-07-29 (×8): qty 1

## 2014-07-29 MED ORDER — ENOXAPARIN SODIUM 80 MG/0.8ML ~~LOC~~ SOLN
70.0000 mg | Freq: Every day | SUBCUTANEOUS | Status: DC
  Administered 2014-07-29 – 2014-07-30 (×2): 70 mg via SUBCUTANEOUS
  Filled 2014-07-29 (×3): qty 0.8

## 2014-07-29 NOTE — Progress Notes (Addendum)
[facility] PROGRESS NOTE  Yesenia Howard HWT:[id number] DOB: 1956/05/25 DOA: 07/28/2014 PCP: No primary care provider on file.  Assessment/Plan: Principal Problem:   SOB (shortness of breath) Active Problems:   Bronchitis   Diabetes mellitus without complication   Elevated blood-pressure reading without diagnosis of hypertension   1. SOB (shortness of breath)- due to Acute Bronchitis Continue telemetry, the patient had a CT scan at [facility], will try to obtain records from there Continue IV Solu-Medrol No indication for antibiotics at this time Start the patient on Dulera PPI for reflux 2-D echo D-dimer negative     3. Diabetes mellitus without complication SSI coveragfe PRN Check HbA1C.     4. Elevated blood-pressure reading without diagnosis of hypertension Monitor BPs PRN IV Hydralazine   5. DVT Prophylaxis Lopvenox  Code Status: full Family Communication: family updated about patient's clinical progress Disposition Plan:  As above    Brief narrative: *worsening bronchitis with associated cough and shortness of breath for the past few weeks. Patient reports that she was admitted to the hospital for pneumonia approximately 2-1/2 weeks ago at [facility]. Patient reports that since then she has had a cough, wheezing and some shortness of breath. Patient reports that she feels like she's been coughing more, her cough suppressant is not working and she's been having trouble sleeping at night. Patient also reports feeling short of breath like her chest is tight with wheezing. Patient was seen in the ED on 07/24/2014 and 07/25/2014 for bronchitis. Patient was last given Vicodin, albuterol and azithromycin which she reports she has been taking without  improvement. Patient reports that she's tried Mucinex DM, and taken azithromycin without relief today. Patient brought she is used albuterol treatments 3 times today with minimal relief. Patient reports she has diabetes but was taken off her metformin during her hospital admission to half weeks ago due to her sugars being low normal. The patient reports a good appetite and has been eating and drinking normally. Patient denies fevers, chills, sore throat, nasal congestion, ear pain, abdominal pain, nausea, vomiting, chest pain, palpitations, weakness, fatigue, changes to her appetite  Consultants:  None   Procedures:  None   Antibiotics: Anti-infectives:@  HPI/Subjective: Still wheezing, chest tightness, nonproductive cough   Objective: Filed Vitals:   07/29/14 0043 07/29/14 0349 07/29/14 0534 07/29/14 0833  BP:   141/64   Pulse:   97   Temp:   98.1 F (36.7 C)   TempSrc:   Oral   Resp:   18   Height:      Weight:      SpO2: 94% 92% 98% 99%    Intake/Output Summary (Last 24 hours) at 07/29/14 1102 Last data filed at 07/29/14 0730  Gross per 24 hour  Intake    720 ml  Output      0 ml  Net    720 ml    Exam:  General: alert & oriented x 3 In NAD  Cardiovascular: RRR, nl S1 s2  Respiratory: Decreased breath sounds at the bases, wheezing present , no crackles  Abdomen: soft +BS NT/ND, no masses palpable  Extremities: No cyanosis and no edema      Data Reviewed: Basic Metabolic Panel:  Recent Labs Lab 07/25/14 1914 07/28/14 2015 07/29/14 0500  NA 139 142 139  K 4.0 4.3 4.8  CL 102 103 103  CO2 21 27 26   GLUCOSE 179* 145* 211*  BUN 13 12 13   CREATININE 0.64 0.59 0.62  CALCIUM 9.9  9.9 9.7    Liver Function Tests: No results for input(s): AST, ALT, ALKPHOS, BILITOT, PROT, ALBUMIN in the last 168 hours. No results for input(s): LIPASE, AMYLASE in the last 168 hours. No results for input(s): AMMONIA in the last 168 hours.  CBC:  Recent Labs Lab  07/25/14 1914 07/28/14 2015 07/29/14 0500  WBC 8.3 6.1 5.3  NEUTROABS  --  4.0  --   HGB 11.5* 11.1* 11.0*  HCT 36.2 33.7* 34.1*  MCV 89.4 89.2 90.2  PLT 222 230 213    Cardiac Enzymes: No results for input(s): CKTOTAL, CKMB, CKMBINDEX, TROPONINI in the last 168 hours. BNP (last 3 results)  Recent Labs  07/25/14 1914  PROBNP 92.3     CBG:  Recent Labs Lab 07/28/14 1924 07/29/14 0723  GLUCAP 135* 159*    No results found for this or any previous visit (from the past 240 hour(s)).   Studies: Dg Chest 2 View (if Patient Has Fever And/or Copd)  07/28/2014   CLINICAL DATA:  58 year old female with a several week history of cough and shortness of breath  EXAM: CHEST  2 VIEW  COMPARISON:  Prior chest x-ray 07/25/2014  FINDINGS: Cardiac and mediastinal contours remain within normal limits. Progressed central airway thickening and peribronchial cuffing compared to 07/25/2014. No focal airspace consolidation, pleural effusion, pneumothorax or pulmonary edema. No acute osseous abnormality.  IMPRESSION: Progression of central airway thickening and peribronchial cuffing compared to 07/25/2014. Differential considerations include acute bronchitis, viral respiratory infection and exacerbation of a chronic inflammatory process such as asthma.   Electronically Signed   By: Jacqulynn Cadet M.D.   On: 07/28/2014 19:32   Dg Chest 2 View  07/25/2014   CLINICAL DATA:  Continued productive cough with thick brown sputum, headache, abdominal pain, hospital admission 1 month ago for similar symptoms, shortness of breath, history diabetes  EXAM: CHEST  2 VIEW  COMPARISON:  07/24/2014  FINDINGS: Normal heart size, mediastinal contours and pulmonary vascularity.  Peribronchial thickening without infiltrate, pleural effusion or pneumothorax.  Osseous structures unremarkable.  IMPRESSION: Mild bronchitic changes without infiltrate.   Electronically Signed   By: Lavonia Dana M.D.   On: 07/25/2014 18:57    Dg Chest 2 View  07/24/2014   CLINICAL DATA:  Cough and wheezing for 2 months  EXAM: CHEST  2 VIEW  COMPARISON:  September 01, 2013  FINDINGS: The heart size and mediastinal contours are within normal limits. There is no focal infiltrate, pulmonary edema, or pleural effusion. The visualized skeletal structures are unremarkable.  IMPRESSION: No active cardiopulmonary disease.   Electronically Signed   By: Abelardo Diesel M.D.   On: 07/24/2014 10:10    Scheduled Meds: . enoxaparin (LOVENOX) injection  60 mg Subcutaneous QHS  . fluticasone  1 spray Each Nare Daily  . ipratropium-albuterol  3 mL Nebulization Q4H  . levofloxacin  500 mg Oral Daily  . methylPREDNISolone (SOLU-MEDROL) injection  125 mg Intravenous Once  . sodium chloride  3 mL Intravenous Q12H  . sodium chloride  3 mL Intravenous Q12H   Continuous Infusions: . sodium chloride 10 mL/hr (07/28/14 2341)    Principal Problem:   SOB (shortness of breath) Active Problems:   Bronchitis   Diabetes mellitus without complication   Elevated blood-pressure reading without diagnosis of hypertension    Time spent: 40 minutes   [place] [facility] Pager [phone]. If 7PM-7AM, please contact night-coverage at [email or web address], password [facility] 07/29/2014, 11:02 AM  LOS: 1 day

## 2014-07-29 NOTE — Progress Notes (Signed)
  Echocardiogram 2D Echocardiogram has been performed.  Doyle Askew 07/29/2014, 11:56 AM

## 2014-07-29 NOTE — Progress Notes (Signed)
Utilization Review completed.  

## 2014-07-30 LAB — GLUCOSE, CAPILLARY
GLUCOSE-CAPILLARY: 180 mg/dL — AB (ref 70–99)
Glucose-Capillary: 203 mg/dL — ABNORMAL HIGH (ref 70–99)
Glucose-Capillary: 164 mg/dL — ABNORMAL HIGH (ref 70–99)
Glucose-Capillary: 218 mg/dL — ABNORMAL HIGH (ref 70–99)

## 2014-07-30 MED ORDER — LEVALBUTEROL HCL 1.25 MG/0.5ML IN NEBU
1.2500 mg | INHALATION_SOLUTION | Freq: Four times a day (QID) | RESPIRATORY_TRACT | Status: DC | PRN
  Filled 2014-07-30: qty 0.5

## 2014-07-30 MED ORDER — IPRATROPIUM-ALBUTEROL 0.5-2.5 (3) MG/3ML IN SOLN
3.0000 mL | Freq: Three times a day (TID) | RESPIRATORY_TRACT | Status: DC
  Administered 2014-07-30 – 2014-07-31 (×3): 3 mL via RESPIRATORY_TRACT
  Filled 2014-07-30 (×3): qty 3

## 2014-07-30 MED ORDER — METHYLPREDNISOLONE SODIUM SUCC 40 MG IJ SOLR
40.0000 mg | Freq: Two times a day (BID) | INTRAMUSCULAR | Status: DC
  Administered 2014-07-30 – 2014-07-31 (×2): 40 mg via INTRAVENOUS
  Filled 2014-07-30 (×3): qty 1

## 2014-07-30 MED ORDER — INFLUENZA VAC SPLIT QUAD 0.5 ML IM SUSY
0.5000 mL | PREFILLED_SYRINGE | INTRAMUSCULAR | Status: AC
  Administered 2014-07-31: 0.5 mL via INTRAMUSCULAR
  Filled 2014-07-30 (×3): qty 0.5

## 2014-07-30 NOTE — Progress Notes (Signed)
UR completed 

## 2014-07-30 NOTE — Progress Notes (Signed)
SATURATION QUALIFICATIONS: (This note is used to comply with regulatory documentation for home oxygen)  Patient Saturations on Room Air at Rest = 97%  Patient Saturations on Room Air while Ambulating = 98%  Patient Saturations on 0 Liters of oxygen while Ambulating = 98%  Please briefly explain why patient needs home oxygen:

## 2014-07-30 NOTE — Progress Notes (Signed)
Nutrition Brief Note  Patient identified on the Malnutrition Screening Tool (MST) Report  Pt reports good appetite now and PTA. Pt reports weight loss has been intentional. Pt does suspect that some weight loss may be d/t being sick recently.   Wt Readings from Last 15 Encounters:  07/28/14 333 lb 12.8 oz (151.411 kg)  07/24/14 333 lb 5 oz (151.19 kg)  09/12/13 360 lb 4.8 oz (163.431 kg)  04/21/13 300 lb (136.079 kg)  10/20/12 350 lb (158.759 kg)  02/03/12 350 lb (158.759 kg)    Body mass index is 49.27 kg/(m^2). Patient meets criteria for morbid obesity based on current BMI.   Current diet order is Heart healthy/CHO modified, patient is consuming approximately 100% of meals at this time. Labs and medications reviewed.   No nutrition interventions warranted at this time. If nutrition issues arise, please consult RD.   Clayton Bibles, MS, RD, LDN Pager: [phone] After Hours Pager: [phone]

## 2014-07-30 NOTE — Progress Notes (Addendum)
[facility] PROGRESS NOTE  Yesenia Howard NID:[id number] DOB: 07/06/1956 DOA: 07/28/2014 PCP: No primary care provider on file.  Assessment/Plan: Principal Problem:   SOB (shortness of breath) Active Problems:   Bronchitis   Diabetes mellitus without complication   Elevated blood-pressure reading without diagnosis of hypertension    1. SOB (shortness of breath)- due to Acute asthmatic  Bronchitis Continue telemetry, the patient had a CT scan at [facility] showed no PE, just PNA , no interstial lung ds  Continue IV Solu-Medrol, start taper today  No indication for antibiotics at this time Start the patient on Dulera, add xoepenex 2 D echo within normal limits ,  Mould exposure in her apt needs to be addressed  PPI for reflux Ambulatory o2 eval  D-dimer negative     3. Diabetes mellitus without complication SSI coveragfe PRN Check HbA1C.     4. Elevated blood-pressure reading without diagnosis of hypertension Monitor BPs PRN IV Hydralazine   5. DVT Prophylaxis Lopvenox  Code Status: full Family Communication: family updated about patient's clinical progress Disposition Plan:  Anticipate DC in am   Brief narrative: *worsening bronchitis with associated cough and shortness of breath for the past few weeks. Patient reports that she was admitted to the hospital for pneumonia approximately 2-1/2 weeks ago at [facility]. Patient reports that since then she has had a cough, wheezing and some shortness of breath. Patient reports that she feels like she's been coughing more, her cough suppressant is not working and she's been having trouble sleeping at night. Patient also reports feeling short of breath like her chest is tight with wheezing. Patient was  seen in the ED on 07/24/2014 and 07/25/2014 for bronchitis. Patient was last given Vicodin, albuterol and azithromycin which she reports she has been taking without improvement. Patient reports that she's tried Mucinex DM, and taken azithromycin without relief today. Patient brought she is used albuterol treatments 3 times today with minimal relief. Patient reports she has diabetes but was taken off her metformin during her hospital admission to half weeks ago due to her sugars being low normal. The patient reports a good appetite and has been eating and drinking normally. Patient denies fevers, chills, sore throat, nasal congestion, ear pain, abdominal pain, nausea, vomiting, chest pain, palpitations, weakness, fatigue, changes to her appetite  Consultants:  None   Procedures:  None   Antibiotics: Anti-infectives:@  HPI/Subjective: Still wheezing, chest tightness, nonproductive cough   Objective: Filed Vitals:   07/29/14 2246 07/30/14 0110 07/30/14 0552 07/30/14 1131  BP: 155/77 150/74 134/66   Pulse: 94 91 74   Temp: 98.4 F (36.9 C) 98.3 F (36.8 C) 98.1 F (36.7 C)   TempSrc: Oral Oral Oral   Resp: 20 20 20    Height:      Weight:      SpO2: 97% 94% 95% 93%    Intake/Output Summary (Last 24 hours) at 07/30/14 1352 Last data filed at 07/30/14 0700  Gross per 24 hour  Intake    720 ml  Output      0 ml  Net    720 ml    Exam:  General: alert & oriented x 3 In NAD  Cardiovascular: RRR, nl S1 s2  Respiratory: Decreased breath sounds at the bases, wheezing present , no crackles  Abdomen: soft +BS NT/ND, no masses palpable  Extremities: No cyanosis and no edema      Data Reviewed: Basic Metabolic Panel:  Recent Labs Lab 07/25/14 1914 07/28/14 2015  07/29/14 0500  NA 139 142 139  K 4.0 4.3 4.8  CL 102 103 103  CO2 21 27 26   GLUCOSE 179* 145* 211*  BUN 13 12 13   CREATININE 0.64 0.59 0.62  CALCIUM 9.9 9.9 9.7    Liver Function Tests: No results for  input(s): AST, ALT, ALKPHOS, BILITOT, PROT, ALBUMIN in the last 168 hours. No results for input(s): LIPASE, AMYLASE in the last 168 hours. No results for input(s): AMMONIA in the last 168 hours.  CBC:  Recent Labs Lab 07/25/14 1914 07/28/14 2015 07/29/14 0500  WBC 8.3 6.1 5.3  NEUTROABS  --  4.0  --   HGB 11.5* 11.1* 11.0*  HCT 36.2 33.7* 34.1*  MCV 89.4 89.2 90.2  PLT 222 230 213    Cardiac Enzymes: No results for input(s): CKTOTAL, CKMB, CKMBINDEX, TROPONINI in the last 168 hours. BNP (last 3 results)  Recent Labs  07/25/14 1914  PROBNP 92.3     CBG:  Recent Labs Lab 07/29/14 0723 07/29/14 1111 07/29/14 1712 07/30/14 0756 07/30/14 1132  GLUCAP 159* 146* 179* 203* 164*    No results found for this or any previous visit (from the past 240 hour(s)).   Studies: Dg Chest 2 View (if Patient Has Fever And/or Copd)  07/28/2014   CLINICAL DATA:  58 year old female with a several week history of cough and shortness of breath  EXAM: CHEST  2 VIEW  COMPARISON:  Prior chest x-ray 07/25/2014  FINDINGS: Cardiac and mediastinal contours remain within normal limits. Progressed central airway thickening and peribronchial cuffing compared to 07/25/2014. No focal airspace consolidation, pleural effusion, pneumothorax or pulmonary edema. No acute osseous abnormality.  IMPRESSION: Progression of central airway thickening and peribronchial cuffing compared to 07/25/2014. Differential considerations include acute bronchitis, viral respiratory infection and exacerbation of a chronic inflammatory process such as asthma.   Electronically Signed   By: Jacqulynn Cadet M.D.   On: 07/28/2014 19:32   Dg Chest 2 View  07/25/2014   CLINICAL DATA:  Continued productive cough with thick brown sputum, headache, abdominal pain, hospital admission 1 month ago for similar symptoms, shortness of breath, history diabetes  EXAM: CHEST  2 VIEW  COMPARISON:  07/24/2014  FINDINGS: Normal heart size,  mediastinal contours and pulmonary vascularity.  Peribronchial thickening without infiltrate, pleural effusion or pneumothorax.  Osseous structures unremarkable.  IMPRESSION: Mild bronchitic changes without infiltrate.   Electronically Signed   By: Lavonia Dana M.D.   On: 07/25/2014 18:57   Dg Chest 2 View  07/24/2014   CLINICAL DATA:  Cough and wheezing for 2 months  EXAM: CHEST  2 VIEW  COMPARISON:  September 01, 2013  FINDINGS: The heart size and mediastinal contours are within normal limits. There is no focal infiltrate, pulmonary edema, or pleural effusion. The visualized skeletal structures are unremarkable.  IMPRESSION: No active cardiopulmonary disease.   Electronically Signed   By: Abelardo Diesel M.D.   On: 07/24/2014 10:10    Scheduled Meds: . enoxaparin (LOVENOX) injection  70 mg Subcutaneous QHS  . fluticasone  1 spray Each Nare Daily  . ipratropium-albuterol  3 mL Nebulization Q4H  . methylPREDNISolone (SOLU-MEDROL) injection  125 mg Intravenous Once  . methylPREDNISolone (SOLU-MEDROL) injection  40 mg Intravenous Q12H  . pantoprazole  40 mg Oral Daily  . sodium chloride  3 mL Intravenous Q12H  . sodium chloride  3 mL Intravenous Q12H   Continuous Infusions: . sodium chloride 10 mL/hr (07/28/14 2341)    Principal Problem:  SOB (shortness of breath) Active Problems:   Bronchitis   Diabetes mellitus without complication   Elevated blood-pressure reading without diagnosis of hypertension    Time spent: 40 minutes   [place] [facility] Pager [phone]. If 7PM-7AM, please contact night-coverage at [email or web address], password [facility] 07/30/2014, 1:52 PM  LOS: 2 days

## 2014-07-31 DIAGNOSIS — J209 Acute bronchitis, unspecified: Secondary | ICD-10-CM | POA: Diagnosis not present

## 2014-07-31 DIAGNOSIS — Z7982 Long term (current) use of aspirin: Secondary | ICD-10-CM | POA: Diagnosis not present

## 2014-07-31 DIAGNOSIS — E119 Type 2 diabetes mellitus without complications: Secondary | ICD-10-CM | POA: Diagnosis present

## 2014-07-31 DIAGNOSIS — R0602 Shortness of breath: Secondary | ICD-10-CM | POA: Diagnosis not present

## 2014-07-31 DIAGNOSIS — N8320 Unspecified ovarian cysts: Secondary | ICD-10-CM | POA: Diagnosis present

## 2014-07-31 LAB — GLUCOSE, CAPILLARY
GLUCOSE-CAPILLARY: 189 mg/dL — AB (ref 70–99)
Glucose-Capillary: 139 mg/dL — ABNORMAL HIGH (ref 70–99)

## 2014-07-31 MED ORDER — PREDNISONE 5 MG PO TABS: ORAL_TABLET | ORAL | Status: DC

## 2014-07-31 MED ORDER — METFORMIN HCL 500 MG PO TABS: 500.0000 mg | ORAL_TABLET | Freq: Two times a day (BID) | ORAL | Status: DC

## 2014-07-31 MED ORDER — PANTOPRAZOLE SODIUM 40 MG PO TBEC: 40.0000 mg | DELAYED_RELEASE_TABLET | Freq: Two times a day (BID) | ORAL | Status: DC

## 2014-07-31 MED ORDER — HYDROCOD POLST-CHLORPHEN POLST 10-8 MG/5ML PO LQCR: 5.0000 mL | Freq: Two times a day (BID) | ORAL | Status: DC | PRN

## 2014-07-31 MED ORDER — MOMETASONE FURO-FORMOTEROL FUM 100-5 MCG/ACT IN AERO: 2.0000 | INHALATION_SPRAY | Freq: Two times a day (BID) | RESPIRATORY_TRACT | Status: DC

## 2014-07-31 MED ORDER — POLYETHYLENE GLYCOL 3350 17 G PO PACK
17.0000 g | PACK | Freq: Two times a day (BID) | ORAL | Status: DC
  Administered 2014-07-31: 17 g via ORAL
  Filled 2014-07-31 (×2): qty 1

## 2014-07-31 MED ORDER — DM-GUAIFENESIN ER 30-600 MG PO TB12: 2.0000 | ORAL_TABLET | Freq: Two times a day (BID) | ORAL | Status: DC

## 2014-07-31 NOTE — Discharge Instructions (Signed)
Patient's respiratory issues related to environmental triggers including mold Strongly advised to not stay in such hazardous  conditions

## 2014-07-31 NOTE — Discharge Summary (Addendum)
Physician Discharge Summary  Yesenia Howard MRN: [id number] DOB/AGE: April 17, 1956 59 y.o.  PCP: No primary care provider on file.   Admit date: 07/28/2014 Discharge date: 07/31/2014  Discharge Diagnoses:     SOB (shortness of breath) Reactive airway disease, post infectious   Diabetes mellitus type 2   Elevated blood-pressure reading without diagnosis of hypertension  Follow-up recommendations follow-up with PCP in 5-7 days Follow-up with pulmonary in 1-2 weeks       Medication List    STOP taking these medications        azithromycin 250 MG tablet  Commonly known as:  ZITHROMAX     CHERATUSSIN AC 100-10 MG/5ML syrup  Generic drug:  guaiFENesin-codeine      TAKE these medications        albuterol (2.5 MG/3ML) 0.083% nebulizer solution  Commonly known as:  PROVENTIL  Take 2.5 mg by nebulization every 6 (six) hours as needed for wheezing or shortness of breath (wheezing).     albuterol 108 (90 BASE) MCG/ACT inhaler  Commonly known as:  PROVENTIL HFA;VENTOLIN HFA  Inhale 2 puffs into the lungs every 4 (four) hours as needed for wheezing or shortness of breath.     albuterol 108 (90 BASE) MCG/ACT inhaler  Commonly known as:  PROVENTIL HFA;VENTOLIN HFA  Inhale 1-2 puffs into the lungs every 6 (six) hours as needed for wheezing or shortness of breath.     aspirin-acetaminophen-caffeine 250-250-65 MG per tablet  Commonly known as:  EXCEDRIN MIGRAINE  Take 1 tablet by mouth every 6 (six) hours as needed for headache.     benzonatate 100 MG capsule  Commonly known as:  TESSALON  Take 100 mg by mouth every 6 (six) hours as needed for cough (cough).     chlorpheniramine-HYDROcodone 10-8 MG/5ML Lqcr  Commonly known as:  TUSSIONEX  Take 5 mLs by mouth every 12 (twelve) hours as needed for cough.     dextromethorphan-guaiFENesin 30-600 MG per 12 hr tablet  Commonly known as:  MUCINEX DM  Take 2 tablets by mouth 2 (two) times daily.     HYDROcodone-acetaminophen 5-325 MG per tablet  Commonly known as:  NORCO/VICODIN  Take 1 tablet by mouth every 6 (six) hours as needed for moderate pain.     metFORMIN 500 MG tablet  Commonly known as:  GLUCOPHAGE  Take 1 tablet (500 mg total) by mouth 2 (two) times daily with a meal.     mometasone 50 MCG/ACT nasal spray  Commonly known as:  NASONEX  Place 1 spray into the nose daily.     mometasone-formoterol 100-5 MCG/ACT Aero  Commonly known as:  DULERA  Inhale 2 puffs into the lungs 2 (two) times daily.     pantoprazole 40 MG tablet  Commonly known as:  PROTONIX  Take 1 tablet (40 mg total) by mouth 2 (two) times daily.     predniSONE 5 MG tablet  Commonly known as:  DELTASONE  - 8 tabs for 4 days   - 7 tabs for 4 days   - 6 tabs for 4 days   - 5 tabs for 4 days   - 4 tabs for 4 days   - 3 tabs for 4 days   - 2 tabs for 4 days   - 1 tabs for 4 days        Discharge Condition:   Disposition: 01-Home or Self Care   Consults:    Significant Diagnostic Studies: Dg Chest 2 View (if Patient  Has Fever And/or Copd)  07/28/2014   CLINICAL DATA:  58 year old female with a several week history of cough and shortness of breath  EXAM: CHEST  2 VIEW  COMPARISON:  Prior chest x-ray 07/25/2014  FINDINGS: Cardiac and mediastinal contours remain within normal limits. Progressed central airway thickening and peribronchial cuffing compared to 07/25/2014. No focal airspace consolidation, pleural effusion, pneumothorax or pulmonary edema. No acute osseous abnormality.  IMPRESSION: Progression of central airway thickening and peribronchial cuffing compared to 07/25/2014. Differential considerations include acute bronchitis, viral respiratory infection and exacerbation of a chronic inflammatory process such as asthma.   Electronically Signed   By: Jacqulynn Cadet M.D.   On: 07/28/2014 19:32   Dg Chest 2 View  07/25/2014   CLINICAL DATA:  Continued productive cough with thick  brown sputum, headache, abdominal pain, hospital admission 1 month ago for similar symptoms, shortness of breath, history diabetes  EXAM: CHEST  2 VIEW  COMPARISON:  07/24/2014  FINDINGS: Normal heart size, mediastinal contours and pulmonary vascularity.  Peribronchial thickening without infiltrate, pleural effusion or pneumothorax.  Osseous structures unremarkable.  IMPRESSION: Mild bronchitic changes without infiltrate.   Electronically Signed   By: Lavonia Dana M.D.   On: 07/25/2014 18:57   Dg Chest 2 View  07/24/2014   CLINICAL DATA:  Cough and wheezing for 2 months  EXAM: CHEST  2 VIEW  COMPARISON:  September 01, 2013  FINDINGS: The heart size and mediastinal contours are within normal limits. There is no focal infiltrate, pulmonary edema, or pleural effusion. The visualized skeletal structures are unremarkable.  IMPRESSION: No active cardiopulmonary disease.   Electronically Signed   By: Abelardo Diesel M.D.   On: 07/24/2014 10:10     Microbiology: No results found for this or any previous visit (from the past 240 hour(s)).   Labs: Results for orders placed or performed during the hospital encounter of 07/28/14 (from the past 48 hour(s))  Glucose, capillary     Status: Abnormal   Collection Time: 07/29/14  5:12 PM  Result Value Ref Range   Glucose-Capillary 179 (H) 70 - 99 mg/dL   Comment 1 Documented in Chart    Comment 2 Notify RN   Glucose, capillary     Status: Abnormal   Collection Time: 07/30/14  7:56 AM  Result Value Ref Range   Glucose-Capillary 203 (H) 70 - 99 mg/dL  Glucose, capillary     Status: Abnormal   Collection Time: 07/30/14 11:32 AM  Result Value Ref Range   Glucose-Capillary 164 (H) 70 - 99 mg/dL  Glucose, capillary     Status: Abnormal   Collection Time: 07/30/14  4:54 PM  Result Value Ref Range   Glucose-Capillary 218 (H) 70 - 99 mg/dL  Glucose, capillary     Status: Abnormal   Collection Time: 07/30/14 11:03 PM  Result Value Ref Range   Glucose-Capillary 180  (H) 70 - 99 mg/dL  Glucose, capillary     Status: Abnormal   Collection Time: 07/31/14  7:42 AM  Result Value Ref Range   Glucose-Capillary 189 (H) 70 - 99 mg/dL  Glucose, capillary     Status: Abnormal   Collection Time: 07/31/14 11:49 AM  Result Value Ref Range   Glucose-Capillary 139 (H) 70 - 99 mg/dL     HPI Radie Berges is a 58 y.o. female with a history of DM2 and reported history of Pneumonia with hospitalization in mid -November who presents to the ED with complaints of worsening SOB and Productive  cough along with Chest congestion and wheezing. She reports having fevers and chills off and on. She was seen following her hospitalization In the ED and was given a Z-pack and Prednisone pack for Acute bronchitis but her symptoms have continue d to worsen. She report coughing up yellow sputum. She was seen in the ED and had decreased O2 saturation to 87% and was treated with nebulizer treatments and IV solumedrol with minimal improvement and was referred for medical admission. Her chest X-ray was negative for pneumonia but revealed per-bronchial thickening.     HOSPITAL COURSE  1. SOB (shortness of breath)- due to Acute asthmatic Bronchitis Continue telemetry, the patient had a CT scan at [facility] showed no PE, just PNA , no interstial lung ds  Continue IV Solu-Medrol, patient is on prednisone taper No indication for antibiotics at this time Continue Dulera, and Xopenex 2 D echo within normal limits ,  Mould exposure in her apt needs to be addressed  PPI for reflux Ambulatory o2 eval  D-dimer negative tussinex for cough  outpt PFT's      3. Diabetes mellitus without complication SSI coveragfe PRN Check patient CBG elevated* asked to resume  metformin    4. Elevated blood-pressure reading without diagnosis of  hypertension Monitor BPs PRN IV Hydralazine   5. DVT Prophylaxis Lopvenox    Discharge Exam:    Blood pressure 137/78, pulse 87, temperature 98.1 F (36.7 C), temperature source Oral, resp. rate 20, height 5\' 9"  (1.753 m), weight 151.411 kg (333 lb 12.8 oz), SpO2 99 %.  CHEST: Decreased Breath sounds, Diffuse Rhonchi, and +Expiratory wheezes, No Rales.  HEART: Regular rate and rhythm; no murmurs rubs or gallops BACK: No kyphosis or scoliosis; No CVA tenderness ABDOMEN: Positive Bowel Sounds, Obese, Soft Non-Tender; No Masses, No Organomegaly.  Rectal Exam: Not done EXTREMITIES: No Cyanosis, Clubbing, +Trace Edema; No Ulcerations. Genitalia: not examined       Discharge Instructions    Diet - low sodium heart healthy    Complete by:  As directed      Diet - low sodium heart healthy    Complete by:  As directed      Increase activity slowly    Complete by:  As directed      Increase activity slowly    Complete by:  As directed            Follow-up Information    Follow up with pcp. Schedule an appointment as soon as possible for a visit in 1 week.       SignedReyne Dumas 07/31/2014, 11:57 AM

## 2014-08-24 ENCOUNTER — Inpatient Hospital Stay: Payer: Medicaid Other | Admitting: Internal Medicine

## 2014-08-29 ENCOUNTER — Institutional Professional Consult (permissible substitution): Payer: Medicaid Other | Admitting: Internal Medicine

## 2014-09-12 ENCOUNTER — Institutional Professional Consult (permissible substitution): Payer: Medicaid Other | Admitting: Internal Medicine

## 2014-09-19 ENCOUNTER — Institutional Professional Consult (permissible substitution): Payer: Medicaid Other | Admitting: Internal Medicine

## 2014-10-16 ENCOUNTER — Ambulatory Visit (INDEPENDENT_AMBULATORY_CARE_PROVIDER_SITE_OTHER): Payer: Medicaid Other | Admitting: Obstetrics

## 2014-10-16 ENCOUNTER — Encounter: Payer: Self-pay | Admitting: Obstetrics

## 2014-10-16 VITALS — BP 146/83 | HR 82 | Temp 98.1°F | Ht 69.0 in | Wt 348.0 lb

## 2014-10-16 DIAGNOSIS — R399 Unspecified symptoms and signs involving the genitourinary system: Secondary | ICD-10-CM | POA: Diagnosis not present

## 2014-10-16 DIAGNOSIS — Z0000 Encounter for general adult medical examination without abnormal findings: Secondary | ICD-10-CM | POA: Diagnosis not present

## 2014-10-16 DIAGNOSIS — E669 Obesity, unspecified: Secondary | ICD-10-CM

## 2014-10-16 DIAGNOSIS — Z01419 Encounter for gynecological examination (general) (routine) without abnormal findings: Secondary | ICD-10-CM

## 2014-10-16 LAB — POCT URINALYSIS DIPSTICK
BILIRUBIN UA: NEGATIVE
GLUCOSE UA: NEGATIVE
KETONES UA: NEGATIVE
Nitrite, UA: NEGATIVE
PH UA: 6
Spec Grav, UA: 1.02
Urobilinogen, UA: NEGATIVE

## 2014-10-16 MED ORDER — NITROFURANTOIN MONOHYD MACRO 100 MG PO CAPS: 100.0000 mg | ORAL_CAPSULE | Freq: Two times a day (BID) | ORAL | Status: DC

## 2014-10-17 ENCOUNTER — Ambulatory Visit (INDEPENDENT_AMBULATORY_CARE_PROVIDER_SITE_OTHER): Payer: Medicaid Other | Admitting: Internal Medicine

## 2014-10-17 ENCOUNTER — Encounter: Payer: Self-pay | Admitting: Internal Medicine

## 2014-10-17 ENCOUNTER — Encounter: Payer: Self-pay | Admitting: Obstetrics

## 2014-10-17 VITALS — BP 128/76 | HR 86 | Temp 98.0°F | Ht 69.0 in | Wt 351.0 lb

## 2014-10-17 DIAGNOSIS — J4521 Mild intermittent asthma with (acute) exacerbation: Secondary | ICD-10-CM

## 2014-10-17 LAB — URINE CULTURE: Colony Count: 30000

## 2014-10-17 MED ORDER — MOMETASONE FURO-FORMOTEROL FUM 100-5 MCG/ACT IN AERO: 2.0000 | INHALATION_SPRAY | Freq: Two times a day (BID) | RESPIRATORY_TRACT | Status: DC

## 2014-10-17 NOTE — Progress Notes (Signed)
Subjective:     Patient ID: Yesenia Howard, female   DOB: [birth year]      MRN: [id number]  HPI   57 yobf with morbid obesity/  never smoker or prior h/o asthma seen in pulmonary clinic 10/17/14 sp admit:    Admit date: 07/28/2014 Discharge date: 07/31/2014  Discharge Diagnoses:    SOB (shortness of breath) Reactive airway disease, post infectious  Diabetes mellitus type 2  Elevated blood-pressure reading without diagnosis of hypertension  Follow-up recommendations follow-up with PCP in 5-7 days Follow-up with pulmonary in 1-2 weeks       Medication List    STOP taking these medications       azithromycin 250 MG tablet  Commonly known as: ZITHROMAX     CHERATUSSIN AC 100-10 MG/5ML syrup  Generic drug: guaiFENesin-codeine      TAKE these medications       albuterol (2.5 MG/3ML) 0.083% nebulizer solution  Commonly known as: PROVENTIL  Take 2.5 mg by nebulization every 6 (six) hours as needed for wheezing or shortness of breath (wheezing).     albuterol 108 (90 BASE) MCG/ACT inhaler  Commonly known as: PROVENTIL HFA;VENTOLIN HFA  Inhale 2 puffs into the lungs every 4 (four) hours as needed for wheezing or shortness of breath.     albuterol 108 (90 BASE) MCG/ACT inhaler  Commonly known as: PROVENTIL HFA;VENTOLIN HFA  Inhale 1-2 puffs into the lungs every 6 (six) hours as needed for wheezing or shortness of breath.     aspirin-acetaminophen-caffeine 250-250-65 MG per tablet  Commonly known as: EXCEDRIN MIGRAINE  Take 1 tablet by mouth every 6 (six) hours as needed for headache.     benzonatate 100 MG capsule  Commonly known as: TESSALON  Take 100 mg by mouth every 6 (six) hours as needed for cough (cough).     chlorpheniramine-HYDROcodone 10-8 MG/5ML Lqcr  Commonly known as: TUSSIONEX  Take 5 mLs by mouth every 12 (twelve) hours as needed for cough.     dextromethorphan-guaiFENesin 30-600 MG  per 12 hr tablet  Commonly known as: MUCINEX DM  Take 2 tablets by mouth 2 (two) times daily.     HYDROcodone-acetaminophen 5-325 MG per tablet  Commonly known as: NORCO/VICODIN  Take 1 tablet by mouth every 6 (six) hours as needed for moderate pain.     metFORMIN 500 MG tablet  Commonly known as: GLUCOPHAGE  Take 1 tablet (500 mg total) by mouth 2 (two) times daily with a meal.     mometasone 50 MCG/ACT nasal spray  Commonly known as: NASONEX  Place 1 spray into the nose daily.     mometasone-formoterol 100-5 MCG/ACT Aero  Commonly known as: DULERA  Inhale 2 puffs into the lungs 2 (two) times daily.     pantoprazole 40 MG tablet  Commonly known as: PROTONIX  Take 1 tablet (40 mg total) by mouth 2 (two) times daily.     predniSONE 5 MG tablet  Commonly known as: DELTASONE  - 8 tabs for 4 days   - 7 tabs for 4 days   - 6 tabs for 4 days   - 5 tabs for 4 days   - 4 tabs for 4 days   - 3 tabs for 4 days   - 2 tabs for 4 days   - 1 tabs for 4 days        Discharge Condition:   Disposition: 01-Home or Self Care   Consults:    Significant Diagnostic Studies:  Imaging  Results    Dg Chest 2 View (if Patient Has Fever And/or Copd)  07/28/2014 CLINICAL DATA: 59 year old female with a several week history of cough and shortness of breath EXAM: CHEST 2 VIEW COMPARISON: Prior chest x-ray 07/25/2014 FINDINGS: Cardiac and mediastinal contours remain within normal limits. Progressed central airway thickening and peribronchial cuffing compared to 07/25/2014. No focal airspace consolidation, pleural effusion, pneumothorax or pulmonary edema. No acute osseous abnormality. IMPRESSION: Progression of central airway thickening and peribronchial cuffing compared to 07/25/2014. Differential considerations include acute bronchitis, viral respiratory infection and exacerbation of a chronic inflammatory process such as asthma.  Electronically Signed By: Jacqulynn Cadet M.D. On: 07/28/2014 19:32   Dg Chest 2 View  07/25/2014 CLINICAL DATA: Continued productive cough with thick brown sputum, headache, abdominal pain, hospital admission 1 month ago for similar symptoms, shortness of breath, history diabetes EXAM: CHEST 2 VIEW COMPARISON: 07/24/2014 FINDINGS: Normal heart size, mediastinal contours and pulmonary vascularity. Peribronchial thickening without infiltrate, pleural effusion or pneumothorax. Osseous structures unremarkable. IMPRESSION: Mild bronchitic changes without infiltrate. Electronically Signed By: Lavonia Dana M.D. On: 07/25/2014 18:57   Dg Chest 2 View  07/24/2014 CLINICAL DATA: Cough and wheezing for 2 months EXAM: CHEST 2 VIEW COMPARISON: September 01, 2013 FINDINGS: The heart size and mediastinal contours are within normal limits. There is no focal infiltrate, pulmonary edema, or pleural effusion. The visualized skeletal structures are unremarkable. IMPRESSION: No active cardiopulmonary disease. Electronically Signed By: Abelardo Diesel M.D. On: 07/24/2014 10:10      Microbiology: No results found for this or any previous visit (from the past 240 hour(s)).   Labs:  Lab Results Last 48 Hours    Results for orders placed or performed during the hospital encounter of 07/28/14 (from the past 48 hour(s))  Glucose, capillary Status: Abnormal   Collection Time: 07/29/14 5:12 PM  Result Value Ref Range   Glucose-Capillary 179 (H) 70 - 99 mg/dL   Comment 1 Documented in Chart    Comment 2 Notify RN   Glucose, capillary Status: Abnormal   Collection Time: 07/30/14 7:56 AM  Result Value Ref Range   Glucose-Capillary 203 (H) 70 - 99 mg/dL  Glucose, capillary Status: Abnormal   Collection Time: 07/30/14 11:32 AM  Result Value Ref Range   Glucose-Capillary 164 (H) 70 - 99 mg/dL  Glucose, capillary Status: Abnormal    Collection Time: 07/30/14 4:54 PM  Result Value Ref Range   Glucose-Capillary 218 (H) 70 - 99 mg/dL  Glucose, capillary Status: Abnormal   Collection Time: 07/30/14 11:03 PM  Result Value Ref Range   Glucose-Capillary 180 (H) 70 - 99 mg/dL  Glucose, capillary Status: Abnormal   Collection Time: 07/31/14 7:42 AM  Result Value Ref Range   Glucose-Capillary 189 (H) 70 - 99 mg/dL  Glucose, capillary Status: Abnormal   Collection Time: 07/31/14 11:49 AM  Result Value Ref Range   Glucose-Capillary 139 (H) 70 - 99 mg/dL       HPI Yesenia Howard is a 59 y.o. female with a history of DM2 and reported history of Pneumonia with hospitalization in mid -November who presents to the ED with complaints of worsening SOB and Productive cough along with Chest congestion and wheezing. She reports having fevers and chills off and on. She was seen following her hospitalization In the ED and was given a Z-pack and Prednisone pack for Acute bronchitis but her symptoms have continue d to worsen. She report coughing up yellow sputum. She was seen in the ED and had decreased O2  saturation to 87% and was treated with nebulizer treatments and IV solumedrol with minimal improvement and was referred for medical admission. Her chest X-ray was negative for pneumonia but revealed per-bronchial thickening.     HOSPITAL COURSE  1. SOB (shortness of breath)- due to Acute asthmatic Bronchitis Continue telemetry, the patient had a CT scan at [facility] showed no PE, just PNA , no interstial lung ds  Continue IV Solu-Medrol, patient is on prednisone taper No indication for antibiotics at this time Continue Dulera, and Xopenex 2 D echo within normal limits ,  Mould exposure in her apt needs to be addressed  PPI for reflux Ambulatory o2 eval  D-dimer negative tussinex for cough  outpt  PFT's        10/17/2014 1st [facility] office visit/ Dyamond Tolosa  / not using any inhalers at all Chief Complaint  Patient presents with  . Advice Only    hosp f/u [facility]; SOB w/bronchitis and cough while sick; no symptoms at this time.   no inhalers since dec 2015  Main concern is what to do next time she gets sick, did not have prev h/o asthma/ recurrent rhinitis or bronchitis and has no symptoms now  No obvious day to day or daytime variabilty or assoc chronic cough or cp or chest tightness, subjective wheeze overt sinus or hb symptoms. No unusual exp hx or h/o childhood pna/ asthma or knowledge of premature birth.  Sleeping ok without nocturnal  or early am exacerbation  of respiratory  c/o's or need for noct saba. Also denies any obvious fluctuation of symptoms with weather or environmental changes or other aggravating or alleviating factors except as outlined above   Current Medications, Allergies, Complete Past Medical History, Past Surgical History, Family History, and Social History were reviewed in [employer] record.  ROS  The following are not active complaints unless bolded sore throat, dysphagia, dental problems, itching, sneezing,  nasal congestion or excess/ purulent secretions, ear ache,   fever, chills, sweats, unintended wt loss, pleuritic or exertional cp, hemoptysis,  orthopnea pnd or leg swelling, presyncope, palpitations, heartburn, abdominal pain, anorexia, nausea, vomiting, diarrhea  or change in bowel or urinary habits, change in stools or urine, dysuria,hematuria,  rash, arthralgias, visual complaints, headache, numbness weakness or ataxia or problems with walking or coordination,  change in mood/affect or memory.               Review of Systems     Objective:   Physical Exam    obese bf nad   Wt Readings from Last 3 Encounters:  10/17/14 351 lb (159.213 kg)  10/16/14 348 lb (157.852 kg)  07/28/14 333 lb 12.8 oz (151.411 kg)     Vital signs reviewed  HEENT: nl dentition, turbinates, and orophanx. Nl external ear canals without cough reflex   NECK :  without JVD/Nodes/TM/ nl carotid upstrokes bilaterally   LUNGS: no acc muscle use, clear to A and P bilaterally without cough on insp or exp maneuvers   CV:  RRR  no s3 or murmur or increase in P2, no edema   ABD:  soft and nontender with nl excursion in the supine position. No bruits or organomegaly, bowel sounds nl  MS:  warm without deformities, calf tenderness, cyanosis or clubbing  SKIN: warm and dry without lesions    NEURO:  alert, approp, no deficits    I personally reviewed images and agree with radiology impression as follows:  CXR:  07/29/15  central  airway thickening and peribronchial cuffing compared to 07/25/2014. Differential considerations include acute bronchitis, viral respiratory infection and exacerbation of a chronic inflammatory process such as asthma.   Assessment:

## 2014-10-17 NOTE — Patient Instructions (Signed)
If get any wheezing or shortness of breath start back on dulera 100 right away = Take 2 puffs first thing in am and then another 2 puffs about 12 hours later.   Work on Engineer, technical sales technique:  relax and gently blow all the way out then take a nice smooth deep breath back in, triggering the inhaler at same time you start breathing in.  Hold for up to 5 seconds if you can.  Rinse and gargle with water when done  Pulmonary follow up is as needed

## 2014-10-17 NOTE — Progress Notes (Signed)
Subjective:        Yesenia Howard is a 59 y.o. female here for a routine exam.  Current complaints: Urinary frequency and irritaion.    Personal health questionnaire:  Is patient Ashkenazi Jewish, have a family history of breast and/or ovarian cancer: no Is there a family history of uterine cancer diagnosed at age < 39, gastrointestinal cancer, urinary tract cancer, family member who is a Field seismologist syndrome-associated carrier: no Is the patient overweight and hypertensive, family history of diabetes, personal history of gestational diabetes, preeclampsia or PCOS: no Is patient over 36, have PCOS,  family history of premature CHD under age 71, diabetes, smoke, have hypertension or peripheral artery disease:  no At any time, has a partner hit, kicked or otherwise hurt or frightened you?: no Over the past 2 weeks, have you felt down, depressed or hopeless?: no Over the past 2 weeks, have you felt little interest or pleasure in doing things?:no   Gynecologic History No LMP recorded. Patient is postmenopausal. Contraception: post menopausal status Last Pap: unknown. Results were: normal Last mammogram: unknown. Results were: normal  Obstetric History OB History  Gravida Para Term Preterm AB SAB TAB Ectopic Multiple Living  3 1 1  2  2   1     # Outcome Date GA Lbr Len/2nd Weight Sex Delivery Anes PTL Lv  3 TAB 1986          2 TAB 1984          1 Term 87    F CS-LVertical   Y      Past Medical History  Diagnosis Date  . Ovarian cyst   . Retinal detachment   . Cataract   . Diabetes mellitus without complication     Past Surgical History  Procedure Laterality Date  . Eye surgery    . Cesarean section    . Ovarian cyst removal    . Cataract extraction       Current outpatient prescriptions:  .  albuterol (PROVENTIL HFA;VENTOLIN HFA) 108 (90 BASE) MCG/ACT inhaler, Inhale 2 puffs into the lungs every 4 (four) hours as needed for wheezing or shortness of breath., Disp: 1  Inhaler, Rfl: 3 .  albuterol (PROVENTIL HFA;VENTOLIN HFA) 108 (90 BASE) MCG/ACT inhaler, Inhale 1-2 puffs into the lungs every 6 (six) hours as needed for wheezing or shortness of breath., Disp: 1 Inhaler, Rfl: 0 .  chlorpheniramine-HYDROcodone (TUSSIONEX) 10-8 MG/5ML LQCR, Take 5 mLs by mouth every 12 (twelve) hours as needed for cough., Disp: 473 mL, Rfl: 1 .  HYDROcodone-acetaminophen (NORCO/VICODIN) 5-325 MG per tablet, Take 1 tablet by mouth every 6 (six) hours as needed for moderate pain., Disp: 30 tablet, Rfl: 0 .  albuterol (PROVENTIL) (2.5 MG/3ML) 0.083% nebulizer solution, Take 2.5 mg by nebulization every 6 (six) hours as needed for wheezing or shortness of breath (wheezing)., Disp: , Rfl:  .  aspirin-acetaminophen-caffeine (EXCEDRIN MIGRAINE) 250-250-65 MG per tablet, Take 1 tablet by mouth every 6 (six) hours as needed for headache., Disp: , Rfl:  .  benzonatate (TESSALON) 100 MG capsule, Take 100 mg by mouth every 6 (six) hours as needed for cough (cough). , Disp: , Rfl:  .  dextromethorphan-guaiFENesin (MUCINEX DM) 30-600 MG per 12 hr tablet, Take 2 tablets by mouth 2 (two) times daily. (Patient not taking: Reported on 10/16/2014), Disp: 60 tablet, Rfl: 1 .  metFORMIN (GLUCOPHAGE) 500 MG tablet, Take 1 tablet (500 mg total) by mouth 2 (two) times daily with a meal. (Patient  not taking: Reported on 10/16/2014), Disp: 60 tablet, Rfl: 3 .  mometasone (NASONEX) 50 MCG/ACT nasal spray, Place 1 spray into the nose daily., Disp: , Rfl:  .  mometasone-formoterol (DULERA) 100-5 MCG/ACT AERO, Inhale 2 puffs into the lungs 2 (two) times daily. (Patient not taking: Reported on 10/16/2014), Disp: 1 Inhaler, Rfl: 2 .  nitrofurantoin, macrocrystal-monohydrate, (MACROBID) 100 MG capsule, Take 1 capsule (100 mg total) by mouth 2 (two) times daily., Disp: 14 capsule, Rfl: 0 .  pantoprazole (PROTONIX) 40 MG tablet, Take 1 tablet (40 mg total) by mouth 2 (two) times daily. (Patient not taking: Reported on  10/16/2014), Disp: 60 tablet, Rfl: 2 .  predniSONE (DELTASONE) 5 MG tablet, 8 tabs for 4 days  7 tabs for 4 days  6 tabs for 4 days  5 tabs for 4 days  4 tabs for 4 days  3 tabs for 4 days  2 tabs for 4 days  1 tabs for 4 days (Patient not taking: Reported on 10/16/2014), Disp: 200 tablet, Rfl: 1 No Known Allergies  History  Substance Use Topics  . Smoking status: Never Smoker   . Smokeless tobacco: Not on file  . Alcohol Use: No    Family History  Problem Relation Age of Onset  . Diabetes Mother   . Hypertension Mother   . Hypertension Sister       Review of Systems  Constitutional: negative for fatigue and weight loss Respiratory: negative for cough and wheezing Cardiovascular: negative for chest pain, fatigue and palpitations Gastrointestinal: negative for abdominal pain and change in bowel habits Musculoskeletal:negative for myalgias Neurological: negative for gait problems and tremors Behavioral/Psych: negative for abusive relationship, depression Endocrine: negative for temperature intolerance   Genitourinary:negative for abnormal menstrual periods, genital lesions, hot flashes, sexual problems and vaginal discharge.  Positive for urinary frequency Integument/breast: negative for breast lump, breast tenderness, nipple discharge and skin lesion(s)    Objective:       BP 146/83 mmHg  Pulse 82  Temp(Src) 98.1 F (36.7 C)  Ht 5\' 9"  (1.753 m)  Wt 348 lb (157.852 kg)  BMI 51.37 kg/m2 General:   alert  Skin:   no rash or abnormalities  Lungs:   clear to auscultation bilaterally  Heart:   regular rate and rhythm, S1, S2 normal, no murmur, click, rub or gallop  Breasts:   normal without suspicious masses, skin or nipple changes or axillary nodes  Abdomen:  normal findings: no organomegaly, soft, non-tender and no hernia  Pelvis:  External genitalia: normal general appearance Urinary system: urethral meatus normal and bladder without fullness, nontender Vaginal: normal  without tenderness, induration or masses Cervix: normal appearance Adnexa: normal bimanual exam Uterus: anteverted and non-tender, normal size   Lab Review Urine pregnancy test Labs reviewed yes Radiologic studies reviewed yes    Assessment:    Healthy female exam.    Postmenopause.  UTI   Plan:    Education reviewed: calcium supplements, low fat, low cholesterol diet, self breast exams and weight bearing exercise. Mammogram ordered. Follow up in: 1 year.   Meds ordered this encounter  Medications  . nitrofurantoin, macrocrystal-monohydrate, (MACROBID) 100 MG capsule    Sig: Take 1 capsule (100 mg total) by mouth 2 (two) times daily.    Dispense:  14 capsule    Refill:  0   Orders Placed This Encounter  Procedures  . Urine culture  . SureSwab, Vaginosis/Vaginitis Plus  . POCT urinalysis dipstick

## 2014-10-18 ENCOUNTER — Encounter: Payer: Self-pay | Admitting: Internal Medicine

## 2014-10-18 DIAGNOSIS — J4521 Mild intermittent asthma with (acute) exacerbation: Secondary | ICD-10-CM | POA: Insufficient documentation

## 2014-10-18 LAB — PAP IG AND HPV HIGH-RISK: HPV DNA High Risk: NOT DETECTED

## 2014-10-18 NOTE — Assessment & Plan Note (Signed)
Extended discussion with pt reviewing details of admit  No evidence she had chronic asthma before and for sure no evidence now but the highest risk of an exac is a hx of an exac  General asthma guidelines/ rules of 2 reviewed  10/18/2014 p extensive coaching HFA effectiveness =    75% > restart dulera 100 2bid at the first sign of any flare  Pulmonary f/u can be prn

## 2014-10-19 ENCOUNTER — Telehealth: Payer: Self-pay | Admitting: *Deleted

## 2014-10-19 ENCOUNTER — Other Ambulatory Visit: Payer: Self-pay | Admitting: *Deleted

## 2014-10-19 DIAGNOSIS — R399 Unspecified symptoms and signs involving the genitourinary system: Secondary | ICD-10-CM

## 2014-10-19 LAB — SURESWAB, VAGINOSIS/VAGINITIS PLUS
Atopobium vaginae: NOT DETECTED Log (cells/mL)
C. TROPICALIS, DNA: NOT DETECTED
C. albicans, DNA: NOT DETECTED
C. glabrata, DNA: NOT DETECTED
C. parapsilosis, DNA: NOT DETECTED
C. trachomatis RNA, TMA: NOT DETECTED
GARDNERELLA VAGINALIS: NOT DETECTED Log (cells/mL)
LACTOBACILLUS SPECIES: 7.8 Log (cells/mL)
MEGASPHAERA SPECIES: NOT DETECTED Log (cells/mL)
N. gonorrhoeae RNA, TMA: NOT DETECTED
T. VAGINALIS RNA, QL TMA: NOT DETECTED

## 2014-10-19 MED ORDER — NITROFURANTOIN MONOHYD MACRO 100 MG PO CAPS: 100.0000 mg | ORAL_CAPSULE | Freq: Two times a day (BID) | ORAL | Status: DC

## 2014-10-19 NOTE — Telephone Encounter (Signed)
Patient states she was seen in the office and the doctor was supposed to send treatment for UTI. ( Patient labs are negative for UTI and her Rx was canceled by another provider.) 2:00 Attempt to call patient on number provided- no VM set up.

## 2015-02-27 ENCOUNTER — Encounter (HOSPITAL_BASED_OUTPATIENT_CLINIC_OR_DEPARTMENT_OTHER): Payer: Self-pay

## 2015-02-27 ENCOUNTER — Emergency Department (HOSPITAL_BASED_OUTPATIENT_CLINIC_OR_DEPARTMENT_OTHER)
Admission: EM | Admit: 2015-02-27 | Discharge: 2015-02-27 | Disposition: A | Discharge status: Home / Self Care | Payer: Medicaid Other | Attending: Emergency Medicine | Admitting: Emergency Medicine

## 2015-02-27 DIAGNOSIS — M79605 Pain in left leg: Secondary | ICD-10-CM | POA: Insufficient documentation

## 2015-02-27 DIAGNOSIS — Z7951 Long term (current) use of inhaled steroids: Secondary | ICD-10-CM | POA: Insufficient documentation

## 2015-02-27 DIAGNOSIS — Z8742 Personal history of other diseases of the female genital tract: Secondary | ICD-10-CM | POA: Insufficient documentation

## 2015-02-27 DIAGNOSIS — E119 Type 2 diabetes mellitus without complications: Secondary | ICD-10-CM | POA: Insufficient documentation

## 2015-02-27 DIAGNOSIS — M79604 Pain in right leg: Secondary | ICD-10-CM | POA: Diagnosis not present

## 2015-02-27 DIAGNOSIS — E669 Obesity, unspecified: Secondary | ICD-10-CM | POA: Insufficient documentation

## 2015-02-27 DIAGNOSIS — Z79899 Other long term (current) drug therapy: Secondary | ICD-10-CM | POA: Diagnosis not present

## 2015-02-27 DIAGNOSIS — Z9849 Cataract extraction status, unspecified eye: Secondary | ICD-10-CM | POA: Diagnosis not present

## 2015-02-27 DIAGNOSIS — Z8669 Personal history of other diseases of the nervous system and sense organs: Secondary | ICD-10-CM | POA: Diagnosis not present

## 2015-02-27 HISTORY — DX: Sciatica, unspecified side: M54.30

## 2015-02-27 MED ORDER — DEXAMETHASONE SODIUM PHOSPHATE 10 MG/ML IJ SOLN
10.0000 mg | Freq: Once | INTRAMUSCULAR | Status: AC
  Administered 2015-02-27: 10 mg via INTRAMUSCULAR
  Filled 2015-02-27: qty 1

## 2015-02-27 MED ORDER — HYDROCODONE-ACETAMINOPHEN 5-325 MG PO TABS
1.0000 | ORAL_TABLET | Freq: Once | ORAL | Status: AC
  Administered 2015-02-27: 1 via ORAL
  Filled 2015-02-27: qty 1

## 2015-02-27 NOTE — ED Notes (Signed)
Pt c/o pain to bilat leg pain with hx of sciatica-also pain to right side of neck and arm-pain "for a month"-has been seen by PCP and appt with Neuro in August-pt brought to triage in w/c-states she can walk unassisted

## 2015-02-27 NOTE — ED Provider Notes (Signed)
CSN: [id number]     Arrival date & time 02/27/15  1251 History   First MD Initiated Contact with Patient 02/27/15 1340     Chief Complaint  Patient presents with  . Leg Pain     (Consider location/radiation/quality/duration/timing/severity/associated sxs/prior Treatment) HPI Yesenia Howard is a 59 y.o. female with a history of sciatica, obesity, comes in for evaluation of bilateral leg pain. Patient says she is having leg pain related to her sciatica, has an appointment with her primary care next week, but came to the ED for increasing pain. Rates her discomfort as a 10/10. She denies any unusual redness or swelling in her legs, breathing difficulties, chest pain, numbness or weakness. No fevers, chills, back pain. She has tried tramadol and Robaxin without relief.  Past Medical History  Diagnosis Date  . Ovarian cyst   . Retinal detachment   . Cataract   . Diabetes mellitus without complication   . Sciatica    Past Surgical History  Procedure Laterality Date  . Eye surgery    . Cesarean section    . Ovarian cyst removal    . Cataract extraction     Family History  Problem Relation Age of Onset  . Diabetes Mother   . Hypertension Mother   . Hypertension Sister    History  Substance Use Topics  . Smoking status: Never Smoker   . Smokeless tobacco: Not on file  . Alcohol Use: No   OB History    Gravida Para Term Preterm AB TAB SAB Ectopic Multiple Living   3 1 1  2 2    1      Review of Systems A 10 point review of systems was completed and was negative except for pertinent positives and negatives as mentioned in the history of present illness     Allergies  Review of patient's allergies indicates no known allergies.  Home Medications   Prior to Admission medications   Medication Sig Start Date End Date Taking? Authorizing Provider  albuterol (PROVENTIL HFA;VENTOLIN HFA) 108 (90 BASE) MCG/ACT inhaler Inhale 2 puffs into the lungs every 4 (four) hours as  needed for wheezing or shortness of breath. 07/24/14   Britt Bottom, NP  aspirin-acetaminophen-caffeine (EXCEDRIN MIGRAINE) [phone] MG per tablet Take 1 tablet by mouth every 6 (six) hours as needed for headache.    Historical Provider, MD  chlorpheniramine-HYDROcodone (TUSSIONEX) 10-8 MG/5ML LQCR Take 5 mLs by mouth every 12 (twelve) hours as needed for cough. 07/31/14   Reyne Dumas, MD  dextromethorphan-guaiFENesin (MUCINEX DM) 30-600 MG per 12 hr tablet Take 2 tablets by mouth 2 (two) times daily. 07/31/14   Reyne Dumas, MD  HYDROcodone-acetaminophen (NORCO/VICODIN) 5-325 MG per tablet Take 1 tablet by mouth every 6 (six) hours as needed for moderate pain. 07/25/14   Evelina Bucy, MD  mometasone (NASONEX) 50 MCG/ACT nasal spray Place 1 spray into the nose daily.    Historical Provider, MD  mometasone-formoterol (DULERA) 100-5 MCG/ACT AERO Inhale 2 puffs into the lungs 2 (two) times daily. 10/17/14   Tanda Rockers, MD  nitrofurantoin, macrocrystal-monohydrate, (MACROBID) 100 MG capsule Take 1 capsule (100 mg total) by mouth 2 (two) times daily. 10/19/14   Shelly Bombard, MD   BP 153/70 mmHg  Pulse 64  Temp(Src) 98.2 F (36.8 C) (Oral)  Resp 18  Ht 5\' 9"  (1.753 m)  Wt 351 lb (159.213 kg)  BMI 51.81 kg/m2  SpO2 99% Physical Exam  Constitutional: She is oriented to person, place, and  time. She appears well-developed and well-nourished.  Obese  HENT:  Head: Normocephalic and atraumatic.  Mouth/Throat: Oropharynx is clear and moist.  Eyes: Conjunctivae are normal. Pupils are equal, round, and reactive to light. Right eye exhibits no discharge. Left eye exhibits no discharge. No scleral icterus.  Neck: Neck supple.  Cardiovascular: Normal rate, regular rhythm, normal heart sounds and intact distal pulses.   Pulmonary/Chest: Effort normal and breath sounds normal. No respiratory distress. She has no wheezes. She has no rales.  Abdominal: Soft. There is no tenderness.  Musculoskeletal:  Normal range of motion. She exhibits no edema or tenderness.  Neurological: She is alert and oriented to person, place, and time.  Cranial Nerves II-XII grossly intact. Gait is antalgic without ataxia. Baseline motor and sensation.  Skin: Skin is warm and dry. No rash noted.  Psychiatric: She has a normal mood and affect.  Nursing note and vitals reviewed.   ED Course  Procedures (including critical care time) Labs Review Labs Reviewed - No data to display  Imaging Review No results found.   EKG Interpretation None     Meds given in ED:  Medications  dexamethasone (DECADRON) injection 10 mg (10 mg Intramuscular Given 02/27/15 1406)  HYDROcodone-acetaminophen (NORCO/VICODIN) 5-325 MG per tablet 1 tablet (1 tablet Oral Given 02/27/15 1406)    New Prescriptions   No medications on file   Filed Vitals:   02/27/15 1300 02/27/15 1517  BP: 150/69 153/70  Pulse: 92 64  Temp: 98.2 F (36.8 C)   TempSrc: Oral   Resp: 18 18  Height: 5\' 9"  (1.753 m)   Weight: 351 lb (159.213 kg)   SpO2: 98% 99%    MDM  Vitals stable - WNL -afebrile Pt resting comfortably in ED. reports her symptoms have resolved after administration of Decadron and 1 tablet of Norco. She is requesting some decaf coffee, states she feels well and is ready to go home. PE--physical exam is not concerning for DVT or other acute vascular pathology. Normal lung exam.  DDX--this appears to be an exacerbation of a chronic problem. There is no evidence of acute or emergent pathology at this time. Will DC home to follow-up with primary care.  I discussed all relevant lab findings and imaging results with pt and they verbalized understanding. Discussed f/u with PCP within 48 hrs and return precautions, pt very amenable to plan.  Final diagnoses:  Bilateral leg pain       Comer Locket, PA-C 02/27/15 1533  Serita Grit, MD 02/27/15 [phone]

## 2015-02-27 NOTE — Discharge Instructions (Signed)
Please follow-up with your doctors for further evaluation and management of your sciatic pain. Return to ED for new or worsening symptoms.

## 2015-02-27 NOTE — ED Notes (Signed)
Pt a/o having bilateral leg pain for months, seen pcp for this.

## 2015-02-27 NOTE — ED Notes (Signed)
MD at bedside. 

## 2015-03-23 ENCOUNTER — Emergency Department (HOSPITAL_BASED_OUTPATIENT_CLINIC_OR_DEPARTMENT_OTHER)
Admission: EM | Admit: 2015-03-23 | Discharge: 2015-03-23 | Disposition: A | Discharge status: Home / Self Care | Payer: Medicaid Other | Attending: Emergency Medicine | Admitting: Emergency Medicine

## 2015-03-23 ENCOUNTER — Encounter (HOSPITAL_BASED_OUTPATIENT_CLINIC_OR_DEPARTMENT_OTHER): Payer: Self-pay

## 2015-03-23 DIAGNOSIS — K029 Dental caries, unspecified: Secondary | ICD-10-CM | POA: Diagnosis not present

## 2015-03-23 DIAGNOSIS — Z8739 Personal history of other diseases of the musculoskeletal system and connective tissue: Secondary | ICD-10-CM | POA: Diagnosis not present

## 2015-03-23 DIAGNOSIS — Z79899 Other long term (current) drug therapy: Secondary | ICD-10-CM | POA: Insufficient documentation

## 2015-03-23 DIAGNOSIS — Z8669 Personal history of other diseases of the nervous system and sense organs: Secondary | ICD-10-CM | POA: Diagnosis not present

## 2015-03-23 DIAGNOSIS — R220 Localized swelling, mass and lump, head: Secondary | ICD-10-CM | POA: Diagnosis present

## 2015-03-23 DIAGNOSIS — Z7951 Long term (current) use of inhaled steroids: Secondary | ICD-10-CM | POA: Insufficient documentation

## 2015-03-23 DIAGNOSIS — E119 Type 2 diabetes mellitus without complications: Secondary | ICD-10-CM | POA: Diagnosis not present

## 2015-03-23 DIAGNOSIS — K047 Periapical abscess without sinus: Secondary | ICD-10-CM | POA: Insufficient documentation

## 2015-03-23 DIAGNOSIS — Z8742 Personal history of other diseases of the female genital tract: Secondary | ICD-10-CM | POA: Diagnosis not present

## 2015-03-23 MED ORDER — CLINDAMYCIN HCL 300 MG PO CAPS: 300.0000 mg | ORAL_CAPSULE | Freq: Four times a day (QID) | ORAL | Status: DC

## 2015-03-23 MED ORDER — TRAMADOL HCL 50 MG PO TABS: 50.0000 mg | ORAL_TABLET | Freq: Four times a day (QID) | ORAL | Status: DC | PRN

## 2015-03-23 NOTE — ED Provider Notes (Signed)
CSN: [id number]     Arrival date & time 03/23/15  3212 History   First MD Initiated Contact with Patient 03/23/15 [phone]     Chief Complaint  Patient presents with  . Facial Swelling     (Consider location/radiation/quality/duration/timing/severity/associated sxs/prior Treatment) HPI Comments: Patient presents with toothache and facial swelling. She states over the last 2 weeks she's had increasing pain to her left lower tooth. She states that she knows she has rotten teeth and they need to be pulled but she hasn't been able to get into a dentist. She states her last few days she's had some increasing swelling over her left lower tooth. She denies any fevers. There is no vomiting.   Past Medical History  Diagnosis Date  . Ovarian cyst   . Retinal detachment   . Cataract   . Diabetes mellitus without complication   . Sciatica    Past Surgical History  Procedure Laterality Date  . Eye surgery    . Cesarean section    . Ovarian cyst removal    . Cataract extraction     Family History  Problem Relation Age of Onset  . Diabetes Mother   . Hypertension Mother   . Hypertension Sister    History  Substance Use Topics  . Smoking status: Never Smoker   . Smokeless tobacco: Not on file  . Alcohol Use: No   OB History    Gravida Para Term Preterm AB TAB SAB Ectopic Multiple Living   3 1 1  2 2    1      Review of Systems  Constitutional: Negative for fever.  HENT: Positive for dental problem and facial swelling.   Gastrointestinal: Negative for nausea and vomiting.  Skin: Negative for wound.  Neurological: Negative for headaches.      Allergies  Review of patient's allergies indicates no known allergies.  Home Medications   Prior to Admission medications   Medication Sig Start Date End Date Taking? Authorizing Provider  albuterol (PROVENTIL HFA;VENTOLIN HFA) 108 (90 BASE) MCG/ACT inhaler Inhale 2 puffs into the lungs every 4 (four) hours as needed for wheezing or  shortness of breath. 07/24/14   Britt Bottom, NP  aspirin-acetaminophen-caffeine (EXCEDRIN MIGRAINE) [phone] MG per tablet Take 1 tablet by mouth every 6 (six) hours as needed for headache.    Historical Provider, MD  chlorpheniramine-HYDROcodone (TUSSIONEX) 10-8 MG/5ML LQCR Take 5 mLs by mouth every 12 (twelve) hours as needed for cough. 07/31/14   Reyne Dumas, MD  clindamycin (CLEOCIN) 300 MG capsule Take 1 capsule (300 mg total) by mouth 4 (four) times daily. X 7 days 03/23/15   Malvin Johns, MD  dextromethorphan-guaiFENesin [facility] DM) 30-600 MG per 12 hr tablet Take 2 tablets by mouth 2 (two) times daily. 07/31/14   Reyne Dumas, MD  HYDROcodone-acetaminophen (NORCO/VICODIN) 5-325 MG per tablet Take 1 tablet by mouth every 6 (six) hours as needed for moderate pain. 07/25/14   Evelina Bucy, MD  mometasone (NASONEX) 50 MCG/ACT nasal spray Place 1 spray into the nose daily.    Historical Provider, MD  mometasone-formoterol (DULERA) 100-5 MCG/ACT AERO Inhale 2 puffs into the lungs 2 (two) times daily. 10/17/14   Tanda Rockers, MD  nitrofurantoin, macrocrystal-monohydrate, (MACROBID) 100 MG capsule Take 1 capsule (100 mg total) by mouth 2 (two) times daily. 10/19/14   Shelly Bombard, MD  traMADol (ULTRAM) 50 MG tablet Take 1 tablet (50 mg total) by mouth every 6 (six) hours as needed. 03/23/15   Garnell Phenix  Tyrek Lawhorn, MD   BP 159/77 mmHg  Pulse 90  Temp(Src) 98.6 F (37 C) (Oral)  Resp 20  Ht 5\' 9"  (1.753 m)  SpO2 98% Physical Exam  Constitutional: She is oriented to person, place, and time.  HENT:  Patient has markedly decayed teeth. There is tenderness involving the decayed left lower bicuspid. There is swelling around this area and swelling to the left mandible. There is no induration or fluctuance. No trismus. Uvula is midline.  Cardiovascular: Normal rate and normal heart sounds.   Pulmonary/Chest: Effort normal and breath sounds normal.  Neurological: She is alert and oriented to person,  place, and time.  Skin: Skin is warm and dry.    ED Course  Procedures (including critical care time) Labs Review Labs Reviewed - No data to display  Imaging Review No results found.   EKG Interpretation None      MDM   Final diagnoses:  Periapical abscess    Patient started on clindamycin and tramadol for pain. She was given a list of outpatient resources for dental follow-up. Return precautions were given.    Malvin Johns, MD 03/23/15 [phone]

## 2015-03-23 NOTE — Discharge Instructions (Signed)
Dental Abscess A dental abscess is a collection of infected fluid (pus) from a bacterial infection in the inner part of the tooth (pulp). It usually occurs at the end of the tooth's root.  CAUSES   Severe tooth decay.  Trauma to the tooth that allows bacteria to enter into the pulp, such as a broken or chipped tooth. SYMPTOMS   Severe pain in and around the infected tooth.  Swelling and redness around the abscessed tooth or in the mouth or face.  Tenderness.  Pus drainage.  Bad breath.  Bitter taste in the mouth.  Difficulty swallowing.  Difficulty opening the mouth.  Nausea.  Vomiting.  Chills.  Swollen neck glands. DIAGNOSIS   A medical and dental history will be taken.  An examination will be performed by tapping on the abscessed tooth.  X-rays may be taken of the tooth to identify the abscess. TREATMENT The goal of treatment is to eliminate the infection. You may be prescribed antibiotic medicine to stop the infection from spreading. A root canal may be performed to save the tooth. If the tooth cannot be saved, it may be pulled (extracted) and the abscess may be drained.  HOME CARE INSTRUCTIONS  Only take over-the-counter or prescription medicines for pain, fever, or discomfort as directed by your caregiver.  Rinse your mouth (gargle) often with salt water ( tsp salt in 8 oz [250 ml] of warm water) to relieve pain or swelling.  Do not drive after taking pain medicine (narcotics).  Do not apply heat to the outside of your face.  Return to your dentist for further treatment as directed. SEEK MEDICAL CARE IF:  Your pain is not helped by medicine.  Your pain is getting worse instead of better. SEEK IMMEDIATE MEDICAL CARE IF:  You have a fever or persistent symptoms for more than 2-3 days.  You have a fever and your symptoms suddenly get worse.  You have chills or a very bad headache.  You have problems breathing or swallowing.  You have trouble  opening your mouth.  You have swelling in the neck or around the eye. Document Released: 08/04/2005 Document Revised: 04/28/2012 Document Reviewed: 11/12/2010 [facility] Patient Information 2015 [place], [place]. This information is not intended to replace advice given to you by your health care provider. Make sure you discuss any questions you have with your health care provider. Emergency Department Resource Guide 1) Find a Doctor and Pay Out of Pocket Although you won't have to find out who is covered by your insurance plan, it is a good idea to ask around and get recommendations. You will then need to call the office and see if the doctor you have chosen will accept you as a new patient and what types of options they offer for patients who are self-pay. Some doctors offer discounts or will set up payment plans for their patients who do not have insurance, but you will need to ask so you aren't surprised when you get to your appointment.  2) Contact Your Local Health Department Not all health departments have doctors that can see patients for sick visits, but many do, so it is worth a call to see if yours does. If you don't know where your local health department is, you can check in your phone book. The CDC also has a tool to help you locate your state's health department, and many state websites also have listings of all of their local health departments.  3) Find a [facility] If  your illness is not likely to be very severe or complicated, you may want to try a walk in clinic. These are popping up all over the country in pharmacies, drugstores, and shopping centers. They're usually staffed by nurse practitioners or physician assistants that have been trained to treat common illnesses and complaints. They're usually fairly quick and inexpensive. However, if you have serious medical issues or chronic medical problems, these are probably not your best option.   Chronic Pain  Problems: Organization         Address     Phone             Notes  [facility]  [phone] Patients need to be referred by their primary care doctor.   Medication Assistance: Organization         Address     Phone             Notes  [facility] Medication [facility] [address] [place], [place] [address] [phone] --Must be a resident of [facility] -- Must have NO insurance coverage whatsoever (no Medicaid/ Medicare, etc.) -- The pt. MUST have a primary care doctor that directs their care regularly and follows them in the community   MedAssist  [phone]   [employer]  [phone]    Agencies that provide inexpensive medical care: Organization         Address     Phone             Notes  [place]  [phone]   [facility]    [phone]   [facility] [place], [place] [address] [phone]   [place] [address], [place] [phone]   Planned Parenthood    [phone]   [facility]    [phone]   [place] and [place] [address], [place] Phone:  [phone], Fax:  [phone] Hours of Operation:  9 am - 6 pm, M-F.  Also accepts Medicaid/Medicare and self-pay.  [facility] for [place] [address], [address], [place] Phone: [phone], Fax: [phone]. Hours of Operation:  8:30 am - 5:30 pm, M-F.  Also accepts Medicaid and self-pay.  [facility] [place] [address] [place] Phone: [phone]   [place], [place], [place] [phone], Ext. [phone] Mondays & Thursdays: 7-9 AM.  First 15 patients are seen on a first come, first serve basis.   [facility] [address], [place] [address] [phone] Accepts Medicaid   [place]  Providers:  Organization         Address     Phone             Notes  [facility] [address], [place] [phone] Also accepts self-pay patients.  [facility] [address], [place]  [phone]   [place], [address], [place] [phone]   [facility] [address], [place] [phone]   Lucianne Lei [address], [place]   [phone] Only accepts [place] Access [place] patients after they have their name applied to their card.  Self-Pay (no insurance) in [facility]:  Organization         Address     Phone             Notes  Sickle Cell Patients, [facility] [place] [phone]   [facility] [place] [phone]   [facility] [place]  [place], [place], [place] [phone]   [facility]/Dr. Osei-Bonsu  [address], [place] or [address], [place] [phone] Phone number for both [place] and [place] locations is the same.  Urgent Medical and [facility] [address], [place] [phone]   [facility] [address] [place] or [address] [phone]   [facility] [address] [place] [phone], phone; [phone], fax Sees patients 1st and 3rd Saturday of every month.  Must not qualify for public or private insurance (i.e. Medicaid, Medicare, [place] Health Choice, Veterans' Benefits)  Household income should be no more than 200% of the poverty level The clinic cannot treat you if you are pregnant or think you are pregnant  Sexually transmitted diseases are not treated at the clinic.    Dental Care:  Organization         Address     Phone             Notes  [facility] Department of [facility] [place] [phone] Accepts children up to age 64 who are enrolled in [place] or [place]; pregnant women with a Medicaid card; and children who have applied for Medicaid or [place] Health Choice, but were declined, whose parents can pay a reduced fee at time of service.  [facility] Department of [facility]  [address], [place] [phone] Accepts children up to age 3 who are enrolled in [place] or [place]; pregnant women with a Medicaid card; and children who have applied for Medicaid or [place] Health Choice, but were declined, whose parents can pay a reduced fee at time of service.  [place] Adult Dental Access PROGRAM  [place] [phone] Patients are seen by appointment only. Walk-ins are not accepted. [place] will see patients 63 years of age and older. Monday - Tuesday (8am-5pm) Most Wednesdays (8:30-5pm) $30 per visit, cash only  [facility] Adult Dental Access PROGRAM  [address], [facility] [phone] Patients are seen by appointment only. Walk-ins are not accepted. [place] will see patients 98 years of age and older. One Wednesday Evening (Monthly: Volunteer Based).  $30 per visit, cash only  [place]  [phone] for adults; Children under age 69, call Graduate Pediatric Dentistry at [phone]. Children aged 42-14, please call [phone] to request a pediatric application.  Dental services are provided in all areas of dental care including fillings, crowns and bridges, complete and partial dentures, implants, gum treatment, root canals, and extractions. Preventive care is also provided. Treatment is provided to both adults and children. Patients are selected via a lottery and there is often a waiting list.   [facility] [address] [place]  [phone]  [email or web address], [place], [place] [phone], Ext. [phone] Second  and Fourth Thursday of each month, opens at 6:30 AM; Clinic ends at 9 AM.  Patients are seen on a first-come first-served basis, and a limited number are seen during each clinic.   [facility]  [address] Hillard Danker [place], [place] [phone]   Eligibility Requirements You must have lived in [place], [place], or [place] counties for at least the last three months.   You cannot be eligible for state or federal sponsored [employer], including [employer], [place], or [employer].   You generally cannot be eligible for healthcare insurance through your employer.    How to apply: Eligibility screenings are held every Tuesday and Wednesday afternoon from 1:00 pm until 4:00 pm. You do not need an appointment for the interview!  [facility] [address] [place], [place]   [place]  [place] Department  [place]  [phone]    Behavioral Health Resources in the Community: Intensive Outpatient Programs Organization         Address     Phone             Notes  [place]. [address] [place], [place] [phone]   [facility] [address] [place], [place]   ADS: Alcohol & Drug Svcs [address] [place], [place]   [place] [address],  [place], [place] or [phone]     Substance Abuse Resources Organization         Address     Phone             Notes  Alcohol and Drug Services  [phone]   [place]  [phone]   The [place]   Chinita Pester  [phone]   Residential & Outpatient Substance Abuse Program  [phone]   Psychological  Services Organization         Address     Phone             Notes  [facility] [place]  [place]  [phone]   [place] [address] [place] or [phone]    Mobile Crisis Teams Organization         Address     Phone             Notes  Therapeutic Alternatives, Mobile Crisis Care Unit  [phone]   Assertive Psychotherapeutic Services  [address] [place], [place]   [place] Levels [address] [place] Albert [phone]    Self-Help/Support Groups Organization         Address     Phone             Notes  [place]. of [place] - variety of support groups  [place] Call for more information  Narcotics Anonymous (NA), [facility]aring [facility] [address], [employer] [place]  2 meetings at this location   Special educational needs teacher         Address     Phone             Notes  [facility]AP Residential Treatment [place],    [place]  [place]  [address], [place], [place], [place]   [place] [phone]  [place], [place] [phone] Admissions: 8am-3pm M-F  Incentives Substance [place] [address],    [place], [place] [phone]   [facility] [address] [place], [place], [place]   [facility] [address]  [place], [place]   Insight Programs - Intensive Outpatient [address] Kristeen [address], [place], [place]   [facility] ([place].) [place].,  [place], [place] [phone] or [phone]   [facility]idential Treatment Services (RTS) [address] [place], [place] Accepts Medicaid  [facility] [place] [address],  [place] [phone] Substance Abuse/Addiction Treatment   [facility] Organization         Address     Phone              Notes  [facility]  [phone]   Domenic Schwab, PhD [address] Arlis Porta [place], [place]   [phone] or [phone]   [place], [place] [phone]   [facility] [address] [place], [place] [phone] Insurance/Medicaid/sponsorship through [facility] and Families [address] [place]                                    [place], [place] [phone] [place] [address], [place] [phone]    Dr. Adele Schilder  [phone]   [facility] [place] [place] [facility] Dept. 1) [address]  2) [place] 3)  [address], [place] [phone]  [phone]   [place] [phone] or [phone] (After Hours)     [place]: Abuse and Neglect Organization         Address     Phone             Notes  Child/Elder Abuse Hotline  [phone]   Family Abuse Services  [phone] 24 hour crisis line  [facility]  [place]  [place] & Substance Use Organization         Address     Phone             Notes  [employer]   8731237442 24 hour crisis line  Advance Access  [address] [place] [phone] Monday- Friday, walk-in,  8am-8pm  RTSA Detoxification & Crisis Stabilization  [phone]   Alcoholics Anonymous (  [phone] [employer]  Narcotics Anonymous  [place] & Urgent Care Centers Organization         Address     Phone             Notes  [place] Department  [place] at [place]   [place]   [facility]  [phone] Uninsured  patients meeting eligibility requirement  [place]  [place]  [place]  [place]      [facility]   [place]  [phone]     Additional [facility] Resources Organization         Address     Phone             Notes  [place] and [place]  [place]  [phone] Medicaid, Nutrition, Medicine Assistance, Utility Assistance  [place] [phone]   [facility] [facility]  [phone]   [place]  [phone] [place] of Selena Lesser  [phone] Adult & family shelter, food, utility & rent assistance  24 Hour crisis line for those facing homelessness  [phone]   [facility] Transit  3033955067 Roxy Manns, [facility] public transportation system  Homecare Providers  [phone] HIV/AIDS Case Management, FREE HIV SCREEN  Medication Management  [phone] Ongoing medication assistance for patients meeting eligibility requirements  Medication Drop Box Locations: [place] Dept., [facility] Police Dept., [place] Dept., [facility] office  Safely rid of unused medications  [employer]  [phone] Crisis assistance, medication, housing, food, utility assistance  [place].  [facility])  [phone]

## 2015-03-23 NOTE — ED Notes (Signed)
PT reports left sided facial pain, swelling noted to left chin, reports pain is now radiating to right facial area. Denies chest pain. Poor dental hygiene noted with broken, rotten and missing teeth.

## 2015-07-20 ENCOUNTER — Emergency Department (HOSPITAL_BASED_OUTPATIENT_CLINIC_OR_DEPARTMENT_OTHER)
Admission: EM | Admit: 2015-07-20 | Discharge: 2015-07-20 | Disposition: A | Discharge status: Home / Self Care | Payer: Medicaid Other | Attending: Emergency Medicine | Admitting: Emergency Medicine

## 2015-07-20 ENCOUNTER — Encounter (HOSPITAL_BASED_OUTPATIENT_CLINIC_OR_DEPARTMENT_OTHER): Payer: Self-pay | Admitting: *Deleted

## 2015-07-20 DIAGNOSIS — R21 Rash and other nonspecific skin eruption: Secondary | ICD-10-CM | POA: Diagnosis present

## 2015-07-20 DIAGNOSIS — S20362A Insect bite (nonvenomous) of left front wall of thorax, initial encounter: Secondary | ICD-10-CM | POA: Diagnosis not present

## 2015-07-20 DIAGNOSIS — E119 Type 2 diabetes mellitus without complications: Secondary | ICD-10-CM | POA: Insufficient documentation

## 2015-07-20 DIAGNOSIS — S40861A Insect bite (nonvenomous) of right upper arm, initial encounter: Secondary | ICD-10-CM | POA: Diagnosis not present

## 2015-07-20 DIAGNOSIS — W57XXXA Bitten or stung by nonvenomous insect and other nonvenomous arthropods, initial encounter: Secondary | ICD-10-CM | POA: Insufficient documentation

## 2015-07-20 DIAGNOSIS — Y9389 Activity, other specified: Secondary | ICD-10-CM | POA: Insufficient documentation

## 2015-07-20 DIAGNOSIS — Z7951 Long term (current) use of inhaled steroids: Secondary | ICD-10-CM | POA: Insufficient documentation

## 2015-07-20 DIAGNOSIS — Y9289 Other specified places as the place of occurrence of the external cause: Secondary | ICD-10-CM | POA: Insufficient documentation

## 2015-07-20 DIAGNOSIS — Z79899 Other long term (current) drug therapy: Secondary | ICD-10-CM | POA: Diagnosis not present

## 2015-07-20 DIAGNOSIS — Y998 Other external cause status: Secondary | ICD-10-CM | POA: Diagnosis not present

## 2015-07-20 DIAGNOSIS — N390 Urinary tract infection, site not specified: Secondary | ICD-10-CM | POA: Diagnosis not present

## 2015-07-20 DIAGNOSIS — Z9849 Cataract extraction status, unspecified eye: Secondary | ICD-10-CM | POA: Diagnosis not present

## 2015-07-20 DIAGNOSIS — Z8742 Personal history of other diseases of the female genital tract: Secondary | ICD-10-CM | POA: Diagnosis not present

## 2015-07-20 DIAGNOSIS — S40862A Insect bite (nonvenomous) of left upper arm, initial encounter: Secondary | ICD-10-CM | POA: Insufficient documentation

## 2015-07-20 DIAGNOSIS — S20361A Insect bite (nonvenomous) of right front wall of thorax, initial encounter: Secondary | ICD-10-CM | POA: Insufficient documentation

## 2015-07-20 DIAGNOSIS — B86 Scabies: Secondary | ICD-10-CM | POA: Diagnosis not present

## 2015-07-20 LAB — URINE MICROSCOPIC-ADD ON

## 2015-07-20 LAB — URINALYSIS, ROUTINE W REFLEX MICROSCOPIC
Bilirubin Urine: NEGATIVE
Glucose, UA: NEGATIVE mg/dL
KETONES UR: NEGATIVE mg/dL
NITRITE: POSITIVE — AB
PROTEIN: 30 mg/dL — AB
Specific Gravity, Urine: 1.015 (ref 1.005–1.030)
pH: 6.5 (ref 5.0–8.0)

## 2015-07-20 MED ORDER — HYDROCORTISONE 1 % EX CREA: TOPICAL_CREAM | CUTANEOUS | Status: DC

## 2015-07-20 MED ORDER — LORATADINE 10 MG PO TABS: 10.0000 mg | ORAL_TABLET | Freq: Every day | ORAL | Status: DC

## 2015-07-20 MED ORDER — PERMETHRIN 5 % EX CREA: TOPICAL_CREAM | CUTANEOUS | Status: DC

## 2015-07-20 MED ORDER — DIPHENHYDRAMINE HCL 25 MG PO CAPS
25.0000 mg | ORAL_CAPSULE | Freq: Once | ORAL | Status: AC
  Administered 2015-07-20: 25 mg via ORAL
  Filled 2015-07-20: qty 1

## 2015-07-20 MED ORDER — CEPHALEXIN 500 MG PO CAPS: 500.0000 mg | ORAL_CAPSULE | Freq: Four times a day (QID) | ORAL | Status: DC

## 2015-07-20 NOTE — ED Notes (Signed)
Pt c/o urinary tract infection.  UA ordered.

## 2015-07-20 NOTE — ED Notes (Signed)
Rash and itching since staying the night with family last night. She thinks she got bitten by bed bugs.

## 2015-07-20 NOTE — ED Provider Notes (Signed)
CSN: [id number]     Arrival date & time 07/20/15  1311 History   First MD Initiated Contact with Patient 07/20/15 1402     Chief Complaint  Patient presents with  . Rash     (Consider location/radiation/quality/duration/timing/severity/associated sxs/prior Treatment) HPI  PCP: Marguerita Merles, MD PMH: diabetes  Yesenia Howard is a 59 y.o.  female  CHIEF COMPLAINT: bug bites  Patient to the Er with complaints of rash to body. She has been staying with her niece who has bed bugs. She has been there for about 1 month and three nights started to notice she was getting bit. She saw the bed bugs. She has bites to her bilateral arms, neck, chest wall and hands. Has taken green alcohol to skin and witch hazel. Patient also have odor to urine for about 1-2 weeks. No pain or burning  ROS: The patient denies diaphoresis, fever, headache, weakness (general or focal), confusion, change of vision,  dysphagia, aphagia, shortness of breath, abdominal pains, nausea, vomiting, diarrhea, lower extremity swelling, neck pain, chest pain   Past Medical History  Diagnosis Date  . Ovarian cyst   . Retinal detachment   . Cataract   . Diabetes mellitus without complication ([place])   . Sciatica    Past Surgical History  Procedure Laterality Date  . Eye surgery    . Cesarean section    . Ovarian cyst removal    . Cataract extraction     Family History  Problem Relation Age of Onset  . Diabetes Mother   . Hypertension Mother   . Hypertension Sister    Social History  Substance Use Topics  . Smoking status: Never Smoker   . Smokeless tobacco: None  . Alcohol Use: No   OB History    Gravida Para Term Preterm AB TAB SAB Ectopic Multiple Living   3 1 1  2 2    1      Review of Systems   Review of Systems  Gen: no weight loss, fevers, chills, night sweats  Eyes: no occular draining, occular pain,  No visual changes  Nose: no epistaxis or rhinorrhea  Mouth: no dental pain, no sore throat   Neck: no neck pain  Lungs: No hemoptysis. No wheezing or coughing CV:  No palpitations, dependent edema or orthopnea. No chest pain Abd: no diarrhea. No nausea or vomiting, No abdominal pain  GU: no dysuria or gross hematuria  MSK:  No muscle weakness, No muscular pain Neuro: no headache, no focal neurologic deficits  Skin: + rash , no wounds Psyche: no complaints of depression or anxiety    Allergies  Review of patient's allergies indicates no known allergies.  Home Medications   Prior to Admission medications   Medication Sig Start Date End Date Taking? Authorizing Provider  albuterol (PROVENTIL HFA;VENTOLIN HFA) 108 (90 BASE) MCG/ACT inhaler Inhale 2 puffs into the lungs every 4 (four) hours as needed for wheezing or shortness of breath. 07/24/14   Britt Bottom, NP  aspirin-acetaminophen-caffeine (EXCEDRIN MIGRAINE) [phone] MG per tablet Take 1 tablet by mouth every 6 (six) hours as needed for headache.    Historical Provider, MD  cephALEXin (KEFLEX) 500 MG capsule Take 1 capsule (500 mg total) by mouth 4 (four) times daily. 07/20/15   Cintya Daughety Carlota Raspberry, PA-C  chlorpheniramine-HYDROcodone (TUSSIONEX) 10-8 MG/5ML LQCR Take 5 mLs by mouth every 12 (twelve) hours as needed for cough. 07/31/14   Reyne Dumas, MD  clindamycin (CLEOCIN) 300 MG capsule Take 1 capsule (300 mg  total) by mouth 4 (four) times daily. X 7 days 03/23/15   Malvin Johns, MD  dextromethorphan-guaiFENesin [facility] DM) 30-600 MG per 12 hr tablet Take 2 tablets by mouth 2 (two) times daily. 07/31/14   Reyne Dumas, MD  HYDROcodone-acetaminophen (NORCO/VICODIN) 5-325 MG per tablet Take 1 tablet by mouth every 6 (six) hours as needed for moderate pain. 07/25/14   Evelina Bucy, MD  hydrocortisone cream 1 % Apply to affected area 2 times daily 07/20/15   Delos Haring, PA-C  loratadine (CLARITIN) 10 MG tablet Take 1 tablet (10 mg total) by mouth daily. 07/20/15   Pearl Berlinger Carlota Raspberry, PA-C  mometasone (NASONEX) 50 MCG/ACT nasal  spray Place 1 spray into the nose daily.    Historical Provider, MD  mometasone-formoterol (DULERA) 100-5 MCG/ACT AERO Inhale 2 puffs into the lungs 2 (two) times daily. 10/17/14   Tanda Rockers, MD  nitrofurantoin, macrocrystal-monohydrate, (MACROBID) 100 MG capsule Take 1 capsule (100 mg total) by mouth 2 (two) times daily. 10/19/14   Shelly Bombard, MD  permethrin (ELIMITE) 5 % cream Apply to affected area once 07/20/15   Delos Haring, PA-C  traMADol (ULTRAM) 50 MG tablet Take 1 tablet (50 mg total) by mouth every 6 (six) hours as needed. 03/23/15   Malvin Johns, MD   BP 174/93 mmHg  Pulse 94  Temp(Src) 98.4 F (36.9 C) (Oral)  Resp 18  Ht 5\' 9"  (1.753 m)  Wt 152.862 kg  BMI 49.74 kg/m2  SpO2 98% Physical Exam  Constitutional: She appears well-developed and well-nourished. No distress.  HENT:  Head: Normocephalic and atraumatic.  Eyes: Pupils are equal, round, and reactive to light.  Neck: Normal range of motion. Neck supple.  Cardiovascular: Normal rate and regular rhythm.   Pulmonary/Chest: Effort normal.  Abdominal: Soft.  Neurological: She is alert.  Skin: Skin is warm and dry.  Bug bites to bilateral arms and chest with some associated inflammation. Rash excoriated.  Nursing note and vitals reviewed.   ED Course  Procedures (including critical care time) Labs Review Labs Reviewed  URINALYSIS, ROUTINE W REFLEX MICROSCOPIC (NOT AT [facility]) - Abnormal; Notable for the following:    APPearance CLOUDY (*)    Hgb urine dipstick TRACE (*)    Protein, ur 30 (*)    Nitrite POSITIVE (*)    Leukocytes, UA SMALL (*)    All other components within normal limits  URINE MICROSCOPIC-ADD ON - Abnormal; Notable for the following:    Squamous Epithelial / LPF 0-5 (*)    Bacteria, UA MANY (*)    All other components within normal limits  URINE CULTURE    Imaging Review No results found. I have personally reviewed and evaluated these images and lab results as part of my medical  decision-making.   EKG Interpretation None      MDM   Final diagnoses:  UTI (lower urinary tract infection)  Scabies    Permethrin cream for bites, Benadryl for itching, topical hydrocortisone  urine culture  -- started on Keflex for UTI/ prophylactic for skin infection  Follow-up with PCP.  Medications  diphenhydrAMINE (BENADRYL) capsule 25 mg (not administered)     I feel the patient has had an appropriate workup for their chief complaint at this time and likelihood of emergent condition existing is low. Discussed s/sx that warrant return to the ED.  Filed Vitals:   07/20/15 1314  BP: 174/93  Pulse: 94  Temp: 98.4 F (36.9 C)  Resp: 18  Delos Haring, PA-C 07/20/15 [place] Liu, MD 07/22/15 [phone]

## 2015-07-20 NOTE — Discharge Instructions (Signed)
Urinary Tract Infection Urinary tract infections (UTIs) can develop anywhere along your urinary tract. Your urinary tract is your body's drainage system for removing wastes and extra water. Your urinary tract includes two kidneys, two ureters, a bladder, and a urethra. Your kidneys are a pair of bean-shaped organs. Each kidney is about the size of your fist. They are located below your ribs, one on each side of your spine. CAUSES Infections are caused by microbes, which are microscopic organisms, including fungi, viruses, and bacteria. These organisms are so small that they can only be seen through a microscope. Bacteria are the microbes that most commonly cause UTIs. SYMPTOMS  Symptoms of UTIs may vary by age and gender of the patient and by the location of the infection. Symptoms in young women typically include a frequent and intense urge to urinate and a painful, burning feeling in the bladder or urethra during urination. Older women and men are more likely to be tired, shaky, and weak and have muscle aches and abdominal pain. A fever may mean the infection is in your kidneys. Other symptoms of a kidney infection include pain in your back or sides below the ribs, nausea, and vomiting. DIAGNOSIS To diagnose a UTI, your caregiver will ask you about your symptoms. Your caregiver will also ask you to provide a urine sample. The urine sample will be tested for bacteria and white blood cells. White blood cells are made by your body to help fight infection. TREATMENT  Typically, UTIs can be treated with medication. Because most UTIs are caused by a bacterial infection, they usually can be treated with the use of antibiotics. The choice of antibiotic and length of treatment depend on your symptoms and the type of bacteria causing your infection. HOME CARE INSTRUCTIONS  If you were prescribed antibiotics, take them exactly as your caregiver instructs you. Finish the medication even if you feel better after  you have only taken some of the medication.  Drink enough water and fluids to keep your urine clear or pale yellow.  Avoid caffeine, tea, and carbonated beverages. They tend to irritate your bladder.  Empty your bladder often. Avoid holding urine for long periods of time.  Empty your bladder before and after sexual intercourse.  After a bowel movement, women should cleanse from front to back. Use each tissue only once. SEEK MEDICAL CARE IF:   You have back pain.  You develop a fever.  Your symptoms do not begin to resolve within 3 days. SEEK IMMEDIATE MEDICAL CARE IF:   You have severe back pain or lower abdominal pain.  You develop chills.  You have nausea or vomiting.  You have continued burning or discomfort with urination. MAKE SURE YOU:   Understand these instructions.  Will watch your condition.  Will get help right away if you are not doing well or get worse.   This information is not intended to replace advice given to you by your health care provider. Make sure you discuss any questions you have with your health care provider.   Document Released: 05/14/2005 Document Revised: 04/25/2015 Document Reviewed: 09/12/2011 Elsevier Interactive Patient Education 2016 [place], flies, fleas, bedbugs, and many other insects can bite. Insect bites are different from insect stings. A sting is when poison (venom) is injected into the skin. Insect bites can cause pain or itching for a few days, but they are usually not serious. Some insects can spread diseases to people through a bite. SYMPTOMS  Symptoms of  an insect bite include:  Itching or pain in the bite area.  Redness and swelling in the bite area.  An open wound (skin ulcer). In many cases, symptoms last for 2-4 days.  DIAGNOSIS  This condition is usually diagnosed based on symptoms and a physical exam. TREATMENT  Treatment is usually not needed for an insect bite. Symptoms often go  away on their own. Your health care provider may recommend creams or lotions to help reduce itching. Antibiotic medicines may be prescribed if the bite becomes infected. A tetanus shot may be given in some cases. If you develop an allergic reaction to an insect bite, your health care provider will prescribe medicines to treat the reaction (antihistamines). This is rare. HOME CARE INSTRUCTIONS  Do not scratch the bite area.  Keep the bite area clean and dry. Wash the bite area daily with soap and water as told by your health care provider.  If directed, applyice to the bite area.  Put ice in a plastic bag.  Place a towel between your skin and the bag.  Leave the ice on for 20 minutes, 2-3 times per day.  To help reduce itching and swelling, try applying a baking soda paste, cortisone cream, or calamine lotion to the bite area as told by your health care provider.  Apply or take over-the-counter and prescription medicines only as told by your health care provider.  If you were prescribed an antibiotic medicine, use it as told by your health care provider. Do not stop using the antibiotic even if your condition improves.  Keep all follow-up visits as told by your health care provider. This is important. PREVENTION   Use insect repellent. The best insect repellents contain:  DEET, picaridin, oil of lemon eucalyptus (OLE), or [id number].  Higher amounts of an active ingredient.  When you are outdoors, wear clothing that covers your arms and legs.  Avoid opening windows that do not have window screens. SEEK MEDICAL CARE IF:  You have increased redness, swelling, or pain in the bite area.  You have a fever. SEEK IMMEDIATE MEDICAL CARE IF:   You have joint pain.   You have fluid, blood, or pus coming from the bite area.  You have a headache or neck pain.  You have unusual weakness.  You have a rash.  You have chest pain or shortness of breath.  You have abdominal pain,  nausea, or vomiting.  You feel unusually tired or sleepy.   This information is not intended to replace advice given to you by your health care provider. Make sure you discuss any questions you have with your health care provider.   Document Released: 09/11/2004 Document Revised: 04/25/2015 Document Reviewed: 12/20/2014 Elsevier Interactive Patient Education 2016 [employer] Rash A rash is a change in the color or texture of the skin. There are many different types of rashes. You may have other problems that accompany your rash. CAUSES   Infections.  Allergic reactions. This can include allergies to pets or foods.  Certain medicines.  Exposure to certain chemicals, soaps, or cosmetics.  Heat.  Exposure to poisonous plants.  Tumors, both cancerous and noncancerous. SYMPTOMS   Redness.  Scaly skin.  Itchy skin.  Dry or cracked skin.  Bumps.  Blisters.  Pain. DIAGNOSIS  Your caregiver may do a physical exam to determine what type of rash you have. A skin sample (biopsy) may be taken and examined under a microscope. TREATMENT  Treatment depends on the type of rash  you have. Your caregiver may prescribe certain medicines. For serious conditions, you may need to see a skin doctor (dermatologist). HOME CARE INSTRUCTIONS   Avoid the substance that caused your rash.  Do not scratch your rash. This can cause infection.  You may take cool baths to help stop itching.  Only take over-the-counter or prescription medicines as directed by your caregiver.  Keep all follow-up appointments as directed by your caregiver. SEEK IMMEDIATE MEDICAL CARE IF:  You have increasing pain, swelling, or redness.  You have a fever.  You have new or severe symptoms.  You have body aches, diarrhea, or vomiting.  Your rash is not better after 3 days. MAKE SURE YOU:  Understand these instructions.  Will watch your condition.  Will get help right away if you are not doing well or  get worse.   This information is not intended to replace advice given to you by your health care provider. Make sure you discuss any questions you have with your health care provider.   Document Released: 07/25/2002 Document Revised: 08/25/2014 Document Reviewed: 12/20/2014 Elsevier Interactive Patient Education [employer].

## 2015-07-23 LAB — URINE CULTURE: Culture: >=100000

## 2015-07-24 ENCOUNTER — Telehealth (HOSPITAL_BASED_OUTPATIENT_CLINIC_OR_DEPARTMENT_OTHER): Payer: Self-pay | Admitting: Emergency Medicine

## 2015-07-24 NOTE — Telephone Encounter (Signed)
Post ED Visit - Positive Culture Follow-up  Culture report reviewed by antimicrobial stewardship pharmacist:  []  Elenor Quinones, Pharm.D. []  Heide Guile, Pharm.D., BCPS []  Parks Neptune, Pharm.D. []  Alycia Rossetti, Pharm.D., BCPS []  [place], Pharm.D., BCPS, AAHIVP []  Legrand Como, Pharm.D., BCPS, AAHIVP []  Milus Glazier, Pharm.D. []  Stephens November, [place].D. Hughes Better PharmD  Positive urine culture E. Coli Treated with cephalexin, organism sensitive to the same and no further patient follow-up is required at this time.  Hazle Nordmann 07/24/2015, 10:36 AM

## 2015-08-27 ENCOUNTER — Emergency Department (HOSPITAL_COMMUNITY): Payer: Medicaid Other

## 2015-08-27 ENCOUNTER — Emergency Department (HOSPITAL_COMMUNITY)
Admission: EM | Admit: 2015-08-27 | Discharge: 2015-08-27 | Disposition: A | Discharge status: Home / Self Care | Payer: Medicaid Other | Attending: Emergency Medicine | Admitting: Emergency Medicine

## 2015-08-27 ENCOUNTER — Encounter (HOSPITAL_COMMUNITY): Payer: Self-pay | Admitting: Emergency Medicine

## 2015-08-27 DIAGNOSIS — N939 Abnormal uterine and vaginal bleeding, unspecified: Secondary | ICD-10-CM

## 2015-08-27 DIAGNOSIS — E119 Type 2 diabetes mellitus without complications: Secondary | ICD-10-CM | POA: Insufficient documentation

## 2015-08-27 DIAGNOSIS — Z8669 Personal history of other diseases of the nervous system and sense organs: Secondary | ICD-10-CM | POA: Diagnosis not present

## 2015-08-27 DIAGNOSIS — Z8739 Personal history of other diseases of the musculoskeletal system and connective tissue: Secondary | ICD-10-CM | POA: Insufficient documentation

## 2015-08-27 DIAGNOSIS — D259 Leiomyoma of uterus, unspecified: Secondary | ICD-10-CM | POA: Diagnosis not present

## 2015-08-27 DIAGNOSIS — Z79899 Other long term (current) drug therapy: Secondary | ICD-10-CM | POA: Insufficient documentation

## 2015-08-27 DIAGNOSIS — Z3202 Encounter for pregnancy test, result negative: Secondary | ICD-10-CM | POA: Insufficient documentation

## 2015-08-27 LAB — CBC WITH DIFFERENTIAL/PLATELET
BASOS ABS: 0 10*3/uL (ref 0.0–0.1)
Basophils Relative: 0 %
Eosinophils Absolute: 0 10*3/uL (ref 0.0–0.7)
Eosinophils Relative: 0 %
HEMATOCRIT: 34 % — AB (ref 36.0–46.0)
Hemoglobin: 11.1 g/dL — ABNORMAL LOW (ref 12.0–15.0)
LYMPHS ABS: 1.6 10*3/uL (ref 0.7–4.0)
LYMPHS PCT: 20 %
MCH: 29.7 pg (ref 26.0–34.0)
MCHC: 32.6 g/dL (ref 30.0–36.0)
MCV: 90.9 fL (ref 78.0–100.0)
MONO ABS: 0.5 10*3/uL (ref 0.1–1.0)
MONOS PCT: 7 %
NEUTROS ABS: 6 10*3/uL (ref 1.7–7.7)
Neutrophils Relative %: 73 %
Platelets: 223 10*3/uL (ref 150–400)
RBC: 3.74 MIL/uL — ABNORMAL LOW (ref 3.87–5.11)
RDW: 13.3 % (ref 11.5–15.5)
WBC: 8.2 10*3/uL (ref 4.0–10.5)

## 2015-08-27 LAB — BASIC METABOLIC PANEL
Anion gap: 7 (ref 5–15)
BUN: 17 mg/dL (ref 6–20)
CALCIUM: 9.3 mg/dL (ref 8.9–10.3)
CHLORIDE: 105 mmol/L (ref 101–111)
CO2: 27 mmol/L (ref 22–32)
CREATININE: 0.66 mg/dL (ref 0.44–1.00)
GFR calc Af Amer: >60 mL/min (ref >60)
GFR calc non Af Amer: >60 mL/min (ref >60)
Glucose, Bld: 136 mg/dL — ABNORMAL HIGH (ref 65–99)
Potassium: 3.7 mmol/L (ref 3.5–5.1)
Sodium: 139 mmol/L (ref 135–145)

## 2015-08-27 LAB — I-STAT BETA HCG BLOOD, ED (MC, WL, AP ONLY): I-stat hCG, quantitative: <5 m[IU]/mL (ref <5)

## 2015-08-27 MED ORDER — HYDROCODONE-ACETAMINOPHEN 5-325 MG PO TABS: 2.0000 | ORAL_TABLET | ORAL | Status: DC | PRN

## 2015-08-27 MED ORDER — MORPHINE SULFATE (PF) 4 MG/ML IV SOLN
6.0000 mg | Freq: Once | INTRAVENOUS | Status: AC
  Administered 2015-08-27: 6 mg via INTRAMUSCULAR
  Filled 2015-08-27: qty 2

## 2015-08-27 MED ORDER — MORPHINE SULFATE (PF) 4 MG/ML IV SOLN: 6.0000 mg | Freq: Once | INTRAVENOUS | Status: DC

## 2015-08-27 NOTE — Discharge Instructions (Signed)
Abnormal Uterine Bleeding Follow-up with GYN. Return for increased bleeding, dizziness, lightheadedness. Abnormal uterine bleeding means bleeding from the vagina that is not your normal menstrual period. This can be:  Bleeding or spotting between periods.  Bleeding after sex (sexual intercourse).  Bleeding that is heavier or more than normal.  Periods that last longer than usual.  Bleeding after menopause. There are many problems that may cause this. Treatment will depend on the cause of the bleeding. Any kind of bleeding that is not normal should be reviewed by your doctor.  HOME CARE Watch your condition for any changes. These actions may lessen any discomfort you are having:  Do not use tampons or douches as told by your doctor.  Change your pads often. You should get regular pelvic exams and Pap tests. Keep all appointments for tests as told by your doctor. GET HELP IF:  You are bleeding for more than 1 week.  You feel dizzy at times. GET HELP RIGHT AWAY IF:   You pass out.  You have to change pads every 15 to 30 minutes.  You have belly pain.  You have a fever.  You become sweaty or weak.  You are passing large blood clots from the vagina.  You feel sick to your stomach (nauseous) and throw up (vomit). MAKE SURE YOU:  Understand these instructions.  Will watch your condition.  Will get help right away if you are not doing well or get worse.   This information is not intended to replace advice given to you by your health care provider. Make sure you discuss any questions you have with your health care provider.   Document Released: 06/01/2009 Document Revised: 08/09/2013 Document Reviewed: 03/03/2013 Elsevier Interactive Patient Education 2016 [place].  Endometrial Biopsy Endometrial biopsy is a procedure in which a tissue sample is taken from inside the uterus. The tissue sample is then looked at under a microscope to see if the tissue is normal or  abnormal. The endometrium is the lining of the uterus. This procedure helps determine where you are in your menstrual cycle and how hormone levels are affecting the lining of the uterus. This procedure may also be used to evaluate uterine bleeding or to diagnose endometrial cancer, tuberculosis, polyps, or inflammatory conditions.  LET [facility] CARE PROVIDER KNOW ABOUT:  Any allergies you have.  All medicines you are taking, including vitamins, herbs, eye drops, creams, and over-the-counter medicines.  Previous problems you or members of your family have had with the use of anesthetics.  Any blood disorders you have.  Previous surgeries you have had.  Medical conditions you have.  Possibility of pregnancy. RISKS AND COMPLICATIONS Generally, this is a safe procedure. However, as with any procedure, complications can occur. Possible complications include:  Bleeding.  Pelvic infection.  Puncture of the uterine wall with the biopsy device (rare). BEFORE THE PROCEDURE   Keep a record of your menstrual cycles as directed by your health care provider. You may need to schedule your procedure for a specific time in your cycle.  You may want to bring a sanitary pad to wear home after the procedure.  Arrange for someone to drive you home after the procedure if you will be given a medicine to help you relax (sedative). PROCEDURE   You may be given a sedative to relax you.  You will lie on an exam table with your feet and legs supported as in a pelvic exam.  Your health care provider will insert an  instrument (speculum) into your vagina to see your cervix.  Your cervix will be cleansed with an antiseptic solution. A medicine (local anesthetic) will be used to numb the cervix.  A forceps instrument (tenaculum) will be used to hold your cervix steady for the biopsy.  A thin, rodlike instrument (uterine sound) will be inserted through your cervix to determine the length of your uterus  and the location where the biopsy sample will be removed.  A thin, flexible tube (catheter) will be inserted through your cervix and into the uterus. The catheter is used to collect the biopsy sample from your endometrial tissue.  The catheter and speculum will then be removed, and the tissue sample will be sent to a lab for examination. AFTER THE PROCEDURE  You will rest in a recovery area until you are ready to go home.  You may have mild cramping and a small amount of vaginal bleeding for a few days after the procedure. This is normal.  Make sure you find out how to get your test results.   This information is not intended to replace advice given to you by your health care provider. Make sure you discuss any questions you have with your health care provider.   Document Released: 12/05/2004 Document Revised: 04/06/2013 Document Reviewed: 01/19/2013 Elsevier Interactive Patient Education [employer].

## 2015-08-27 NOTE — ED Notes (Signed)
Bed: WA10 Expected date:  Expected time:  Means of arrival:  Comments: EMS 

## 2015-08-27 NOTE — ED Notes (Signed)
Brought in by EMS from home with c/o vaginal bleeding.  Pt reports that she started having profuse vaginal bleeding, onset at around 1500 yesterday; states bleeding has been persistent saturating "1 pad every hour".  Pt also c/o diffused abdominal pain with nausea, no vomiting or diarrhea.  Denies any injury.   Has no significant medical hx.

## 2015-08-27 NOTE — ED Provider Notes (Signed)
CSN: [id number]     Arrival date & time 08/27/15  0215 History   First MD Initiated Contact with Patient 08/27/15 0217     Chief Complaint  Patient presents with  . Vaginal Bleeding   (Consider location/radiation/quality/duration/timing/severity/associated sxs/prior Treatment) Patient is a 60 y.o. female presenting with vaginal bleeding. The history is provided by the patient. No language interpreter was used.  Vaginal Bleeding Yesenia Howard is a 60 year old female with a history of ovarian cyst and diabetes who presents via EMS from home with worsening vaginal bleeding that began approximately 12 hours ago. She states that she used 4 pads within 12 hours with moderate bleeding but pads were not soaked through. She also reports that she had some vaginal spotting about a month ago but did not see a physician. She is also complaining of lower abdominal cramping that became severe in nature and doubled her over for several minutes. She has not had her menstrual cycle in over 20 years. She denies having a hysterectomy or any lower abdominal surgeries.  She denies any dizziness, lightheadedness, palpitations, chest pain, shortness of breath, fever, chills, nausea, vomiting, diarrhea, vaginal discharge, dysuria, hematuria, or urinary frequency.    Past Medical History  Diagnosis Date  . Ovarian cyst   . Retinal detachment   . Cataract   . Diabetes mellitus without complication ([place])   . Sciatica    Past Surgical History  Procedure Laterality Date  . Eye surgery    . Cesarean section    . Ovarian cyst removal    . Cataract extraction     Family History  Problem Relation Age of Onset  . Diabetes Mother   . Hypertension Mother   . Hypertension Sister    Social History  Substance Use Topics  . Smoking status: Never Smoker   . Smokeless tobacco: None  . Alcohol Use: No   OB History    Gravida Para Term Preterm AB TAB SAB Ectopic Multiple Living   3 1 1  2 2    1      Review of  Systems  Genitourinary: Positive for vaginal bleeding.  All other systems reviewed and are negative.     Allergies  Review of patient's allergies indicates no known allergies.  Home Medications   Prior to Admission medications   Medication Sig Start Date End Date Taking? Authorizing Provider  aspirin-acetaminophen-caffeine (EXCEDRIN MIGRAINE) [phone] MG per tablet Take 1 tablet by mouth every 6 (six) hours as needed for headache.   Yes Historical Provider, MD  Multiple Vitamin (MULTIVITAMIN WITH MINERALS) TABS tablet Take 1 tablet by mouth daily. Women's One A Day   Yes Historical Provider, MD  cephALEXin (KEFLEX) 500 MG capsule Take 1 capsule (500 mg total) by mouth 4 (four) times daily. Patient not taking: Reported on 08/27/2015 07/20/15   Delos Haring, PA-C  clindamycin (CLEOCIN) 300 MG capsule Take 1 capsule (300 mg total) by mouth 4 (four) times daily. X 7 days Patient not taking: Reported on 08/27/2015 03/23/15   Malvin Johns, MD  HYDROcodone-acetaminophen (NORCO/VICODIN) 5-325 MG tablet Take 2 tablets by mouth every 4 (four) hours as needed. 08/27/15   Camaron Cammack Patel-Mills, PA-C  hydrocortisone cream 1 % Apply to affected area 2 times daily Patient not taking: Reported on 08/27/2015 07/20/15   Delos Haring, PA-C  loratadine (CLARITIN) 10 MG tablet Take 1 tablet (10 mg total) by mouth daily. Patient not taking: Reported on 08/27/2015 07/20/15   Delos Haring, PA-C  mometasone-formoterol (DULERA) 100-5 MCG/ACT AERO  Inhale 2 puffs into the lungs 2 (two) times daily. Patient not taking: Reported on 08/27/2015 10/17/14   Tanda Rockers, MD  permethrin (ELIMITE) 5 % cream Apply to affected area once Patient not taking: Reported on 08/27/2015 07/20/15   Delos Haring, PA-C  traMADol (ULTRAM) 50 MG tablet Take 1 tablet (50 mg total) by mouth every 6 (six) hours as needed. Patient not taking: Reported on 08/27/2015 03/23/15   Malvin Johns, MD   BP 180/89 mmHg  Pulse 99  Temp(Src) 98.5 F (36.9 C)  (Oral)  Resp 18  SpO2 100% Physical Exam  Constitutional: She is oriented to person, place, and time. She appears well-developed and well-nourished.  Morbidly obese.  HENT:  Head: Normocephalic and atraumatic.  Eyes: Conjunctivae are normal.  Neck: Normal range of motion. Neck supple.  Cardiovascular: Normal rate, regular rhythm and normal heart sounds.   Pulmonary/Chest: Effort normal. No respiratory distress.  Unable to hear lung sounds due to obesity.  Abdominal: Soft. There is no tenderness.    Abdominal tenderness to palpation. No rebound.  Genitourinary:  Pelvic exam: Chaperone present. Small amount of bright red vaginal bleeding from the cervical os. Cervical os appears to be closed. No CMT. Bilateral adnexal tenderness.  Musculoskeletal: Normal range of motion.  Neurological: She is alert and oriented to person, place, and time.  Skin: Skin is warm and dry.  Nursing note and vitals reviewed.   ED Course  Procedures (including critical care time) Labs Review Labs Reviewed  CBC WITH DIFFERENTIAL/PLATELET - Abnormal; Notable for the following:    RBC 3.74 (*)    Hemoglobin 11.1 (*)    HCT 34.0 (*)    All other components within normal limits  BASIC METABOLIC PANEL - Abnormal; Notable for the following:    Glucose, Bld 136 (*)    All other components within normal limits  I-STAT BETA HCG BLOOD, ED ([facility], [facility], [facility] ONLY)    Imaging Review US Transvaginal Non-ob  08/27/2015  CLINICAL DATA:  Vaginal bleeding for 12 hours. History of ovarian cyst. EXAM: TRANSABDOMINAL AND TRANSVAGINAL ULTRASOUND OF PELVIS TECHNIQUE: Both transabdominal and transvaginal ultrasound examinations of the pelvis were performed. Transabdominal technique was performed for global imaging of the pelvis including uterus, ovaries, adnexal regions, and pelvic cul-de-sac. It was necessary to proceed with endovaginal exam following the transabdominal exam to visualize the endometrium. COMPARISON:  None FINDINGS:  Uterus Measurements: 9.8 x 4.1 x 7.6 cm. Myometrial echotexture is heterogeneous. At least 2 discrete fibroids are seen, posterior fundal fibroid measures 2.1 x 1.8 x 2.4 cm, anterior fundal fibroid 1.8 x 1.2 x 1.0 cm. Endometrium Thickness: 8 mm, thickened throughout. No focal abnormality visualized. No hyperemia. Right ovary Measurements: Not visualized.  No adnexal mass. Left ovary Measurements: 1.5 x 1.8 x 1.8 cm. Normal appearance/no adnexal mass. Blood flow is noted. Other findings No abnormal free fluid. IMPRESSION: 1. Fibroid uterus with 2 discrete fundal fibroids, 2.4 cm and 1.8 cm. 2. Endometrial thickening of 8 mm, abnormal in a postmenopausal patient. If bleeding is unresponsive to hormonal or medical therapy, endometrial biopsy recommended. 3. Normal left ovary.  Right ovary is not seen. Electronically Signed   By: Jeb Levering M.D.   On: 08/27/2015 03:58   US Pelvis Complete  08/27/2015  CLINICAL DATA:  Vaginal bleeding for 12 hours. History of ovarian cyst. EXAM: TRANSABDOMINAL AND TRANSVAGINAL ULTRASOUND OF PELVIS TECHNIQUE: Both transabdominal and transvaginal ultrasound examinations of the pelvis were performed. Transabdominal technique was performed for global imaging  of the pelvis including uterus, ovaries, adnexal regions, and pelvic cul-de-sac. It was necessary to proceed with endovaginal exam following the transabdominal exam to visualize the endometrium. COMPARISON:  None FINDINGS: Uterus Measurements: 9.8 x 4.1 x 7.6 cm. Myometrial echotexture is heterogeneous. At least 2 discrete fibroids are seen, posterior fundal fibroid measures 2.1 x 1.8 x 2.4 cm, anterior fundal fibroid 1.8 x 1.2 x 1.0 cm. Endometrium Thickness: 8 mm, thickened throughout. No focal abnormality visualized. No hyperemia. Right ovary Measurements: Not visualized.  No adnexal mass. Left ovary Measurements: 1.5 x 1.8 x 1.8 cm. Normal appearance/no adnexal mass. Blood flow is noted. Other findings No abnormal free  fluid. IMPRESSION: 1. Fibroid uterus with 2 discrete fundal fibroids, 2.4 cm and 1.8 cm. 2. Endometrial thickening of 8 mm, abnormal in a postmenopausal patient. If bleeding is unresponsive to hormonal or medical therapy, endometrial biopsy recommended. 3. Normal left ovary.  Right ovary is not seen. Electronically Signed   By: Jeb Levering M.D.   On: 08/27/2015 03:58   I have personally reviewed and evaluated these images and lab results as part of my medical decision-making.   EKG Interpretation None      MDM   Final diagnoses:  Vaginal bleeding  Uterine leiomyoma, unspecified location  Patient in post menopausal 20 years presents for vaginal bleeding that began 12 hours ago. She reports one episode of vaginal spotting one month ago. She states that she became concerned due to her pelvic cramping. She appears in pain. She is hypertensive but otherwise vitals are normal.  She had vaginal bleeding on exam but was mild and not concerning.  She had a hemoglobin of 11.1 which she states is not unusual for her and that she is normally anemic. Ultrasound of the pelvis shows a fibroid uterus with 2 discrete fibroids measuring 2.5 cm and 2 cm. She also has endometrial thickening of 74mm.   Patient is followed by [facility]. She states that she can call tomorrow to make an appointment. She may need endometrial biopsy which was discussed with her. I discussed return precautions with the patient such as dizziness, lightheadedness, palpations or shortness of breath. She was given pain medication and agrees with plan.   Ottie Glazier, PA-C [phone]  Delora Fuel, MD [id number]

## 2015-08-29 ENCOUNTER — Ambulatory Visit: Payer: Medicaid Other | Admitting: Obstetrics

## 2015-08-31 ENCOUNTER — Encounter: Payer: Self-pay | Admitting: Obstetrics

## 2015-08-31 ENCOUNTER — Ambulatory Visit (INDEPENDENT_AMBULATORY_CARE_PROVIDER_SITE_OTHER): Payer: Medicaid Other | Admitting: Obstetrics

## 2015-08-31 ENCOUNTER — Other Ambulatory Visit: Payer: Self-pay | Admitting: Obstetrics

## 2015-08-31 VITALS — BP 161/82 | HR 80 | Wt 348.0 lb

## 2015-08-31 DIAGNOSIS — Z23 Encounter for immunization: Secondary | ICD-10-CM

## 2015-08-31 DIAGNOSIS — N390 Urinary tract infection, site not specified: Secondary | ICD-10-CM

## 2015-08-31 DIAGNOSIS — N950 Postmenopausal bleeding: Secondary | ICD-10-CM | POA: Diagnosis not present

## 2015-08-31 DIAGNOSIS — R102 Pelvic and perineal pain: Secondary | ICD-10-CM

## 2015-08-31 DIAGNOSIS — E669 Obesity, unspecified: Secondary | ICD-10-CM

## 2015-08-31 MED ORDER — NITROFURANTOIN MONOHYD MACRO 100 MG PO CAPS: 100.0000 mg | ORAL_CAPSULE | Freq: Two times a day (BID) | ORAL | Status: DC

## 2015-08-31 MED ORDER — MEDROXYPROGESTERONE ACETATE 10 MG PO TABS
10.0000 mg | ORAL_TABLET | Freq: Every day | ORAL | Status: DC
Start: 2015-08-31 — End: 2015-10-08

## 2015-08-31 MED ORDER — KETOROLAC TROMETHAMINE 60 MG/2ML IM SOLN
60.0000 mg | Freq: Once | INTRAMUSCULAR | Status: AC
  Administered 2015-08-31: 60 mg via INTRAMUSCULAR

## 2015-08-31 NOTE — Patient Instructions (Addendum)
Endometrial Biopsy Endometrial biopsy is a procedure in which a tissue sample is taken from inside the uterus. The tissue sample is then looked at under a microscope to see if the tissue is normal or abnormal. The endometrium is the lining of the uterus. This procedure helps determine where you are in your menstrual cycle and how hormone levels are affecting the lining of the uterus. This procedure may also be used to evaluate uterine bleeding or to diagnose endometrial cancer, tuberculosis, polyps, or inflammatory conditions.  LET YOUR HEALTH CARE PROVIDER KNOW ABOUT:  Any allergies you have.  All medicines you are taking, including vitamins, herbs, eye drops, creams, and over-the-counter medicines.  Previous problems you or members of your family have had with the use of anesthetics.  Any blood disorders you have.  Previous surgeries you have had.  Medical conditions you have.  Possibility of pregnancy. RISKS AND COMPLICATIONS Generally, this is a safe procedure. However, as with any procedure, complications can occur. Possible complications include:  Bleeding.  Pelvic infection.  Puncture of the uterine wall with the biopsy device (rare). BEFORE THE PROCEDURE   Keep a record of your menstrual cycles as directed by your health care provider. You may need to schedule your procedure for a specific time in your cycle.  You may want to bring a sanitary pad to wear home after the procedure.  Arrange for someone to drive you home after the procedure if you will be given a medicine to help you relax (sedative). PROCEDURE   You may be given a sedative to relax you.  You will lie on an exam table with your feet and legs supported as in a pelvic exam.  Your health care provider will insert an instrument (speculum) into your vagina to see your cervix.  Your cervix will be cleansed with an antiseptic solution. A medicine (local anesthetic) will be used to numb the cervix.  A forceps  instrument (tenaculum) will be used to hold your cervix steady for the biopsy.  A thin, rodlike instrument (uterine sound) will be inserted through your cervix to determine the length of your uterus and the location where the biopsy sample will be removed.  A thin, flexible tube (catheter) will be inserted through your cervix and into the uterus. The catheter is used to collect the biopsy sample from your endometrial tissue.  The catheter and speculum will then be removed, and the tissue sample will be sent to a lab for examination. AFTER THE PROCEDURE  You will rest in a recovery area until you are ready to go home.  You may have mild cramping and a small amount of vaginal bleeding for a few days after the procedure. This is normal.  Make sure you find out how to get your test results.   This information is not intended to replace advice given to you by your health care provider. Make sure you discuss any questions you have with your health care provider.   Document Released: 12/05/2004 Document Revised: 04/06/2013 Document Reviewed: 01/19/2013 Elsevier Interactive Patient Education 2016 [employer] Endometrial Biopsy, Care After Refer to this sheet in the next few weeks. These instructions provide you with information on caring for yourself after your procedure. Your health care provider may also give you more specific instructions. Your treatment has been planned according to current medical practices, but problems sometimes occur. Call your health care provider if you have any problems or questions after your procedure. WHAT TO EXPECT AFTER THE PROCEDURE After   your procedure, it is typical to have the following:  You may have mild cramping and a small amount of vaginal bleeding for a few days after the procedure. This is normal. HOME CARE INSTRUCTIONS  Only take over-the-counter or prescription medicine as directed by your health care provider.  Do not douche, use tampons, or  have sexual intercourse until your health care provider approves.  Follow your health care provider's instructions regarding any activity restrictions, such as strenuous exercise or heavy lifting. SEEK MEDICAL CARE IF:  You have heavy bleeding or bleeding longer than 2 days after the procedure.  You have bad smelling drainage from your vagina.  You have a fever and chills.  Youhave severe lower stomach (abdominal) pain. SEEK IMMEDIATE MEDICAL CARE IF:  You have severe cramps in your stomach or back.  You pass large blood clots.  Your bleeding increases.  You become weak or lightheaded, or you pass out.   This information is not intended to replace advice given to you by your health care provider. Make sure you discuss any questions you have with your health care provider.   Document Released: 05/25/2013 Document Reviewed: 05/25/2013 Elsevier Interactive Patient Education 2016 [employer] Inc.  

## 2015-08-31 NOTE — Progress Notes (Signed)
Patient ID: Yesenia Howard, female   DOB: [birth year], 60 y.o.   MRN: [id number]  Chief Complaint  Patient presents with  . Follow-up    was seen at [facility] on 08-27-15 pt having pain, bleeding, cramping    HPI Yesenia Howard is a 60 y.o. female.  Vaginal bleeding like a period over past few weeks.  Had been postmenopausal for ~ 10 years with no Gyn problems.  HPI  Past Medical History  Diagnosis Date  . Ovarian cyst   . Retinal detachment   . Cataract   . Diabetes mellitus without complication ([place])   . Sciatica     Past Surgical History  Procedure Laterality Date  . Eye surgery    . Cesarean section    . Ovarian cyst removal    . Cataract extraction      Family History  Problem Relation Age of Onset  . Diabetes Mother   . Hypertension Mother   . Hypertension Sister     Social History Social History  Substance Use Topics  . Smoking status: Never Smoker   . Smokeless tobacco: None  . Alcohol Use: No    No Known Allergies  Current Outpatient Prescriptions  Medication Sig Dispense Refill  . aspirin-acetaminophen-caffeine (EXCEDRIN MIGRAINE) 250-250-65 MG per tablet Take 1 tablet by mouth every 6 (six) hours as needed for headache. Reported on 08/31/2015    . Multiple Vitamin (MULTIVITAMIN WITH MINERALS) TABS tablet Take 1 tablet by mouth daily. Women's One A Day    . HYDROcodone-acetaminophen (NORCO/VICODIN) 5-325 MG tablet Take 2 tablets by mouth every 4 (four) hours as needed. (Patient not taking: Reported on 08/31/2015) 12 tablet 0  . hydrocortisone cream 1 % Apply to affected area 2 times daily (Patient not taking: Reported on 08/27/2015) 15 g 0  . loratadine (CLARITIN) 10 MG tablet Take 1 tablet (10 mg total) by mouth daily. (Patient not taking: Reported on 08/27/2015) 14 tablet 0  . medroxyPROGESTERone (PROVERA) 10 MG tablet Take 1 tablet (10 mg total) by mouth daily. 30 tablet 0  . mometasone-formoterol (DULERA) 100-5 MCG/ACT AERO Inhale 2 puffs into the lungs 2  (two) times daily. (Patient not taking: Reported on 08/27/2015) 1 Inhaler 11  . nitrofurantoin, macrocrystal-monohydrate, (MACROBID) 100 MG capsule Take 1 capsule (100 mg total) by mouth 2 (two) times daily. 1 po BID x 7days 14 capsule 2  . permethrin (ELIMITE) 5 % cream Apply to affected area once (Patient not taking: Reported on 08/27/2015) 60 g 0  . traMADol (ULTRAM) 50 MG tablet Take 1 tablet (50 mg total) by mouth every 6 (six) hours as needed. (Patient not taking: Reported on 08/27/2015) 15 tablet 0   No current facility-administered medications for this visit.    Review of Systems Review of Systems Constitutional: negative for fatigue and weight loss Respiratory: negative for cough and wheezing Cardiovascular: negative for chest pain, fatigue and palpitations Gastrointestinal: negative for abdominal pain and change in bowel habits Genitourinary: menstrual-like bleeding and cramping Integument/breast: negative for nipple discharge Musculoskeletal:negative for myalgias Neurological: negative for gait problems and tremors Behavioral/Psych: negative for abusive relationship, depression Endocrine: negative for temperature intolerance     Blood pressure 161/82, pulse 80, weight 348 lb (157.852 kg).  Physical Exam Physical Exam           General:  Alert and no distress Abdomen:  normal findings: no organomegaly, soft, non-tender and no hernia  Pelvis:  External genitalia: normal general appearance Urinary system: urethral meatus normal and bladder without  fullness, nontender Vaginal: normal without tenderness, induration or masses.  Dark, menstrual blood in vault Cervix: normal appearance.  Scant dark blood from os Adnexa: normal bimanual exam Uterus: anteverted and non-tender, normal size      Data Reviewed Labs Ultrasound  Assessment     Postmenopausal Bleeding Morbid obesity     Plan    Endometrial Biopsy done Provera Rx F/U 2 weeks for results Orders Placed This  Encounter  Procedures  . Flu Vaccine QUAD 36+ mos IM   Meds ordered this encounter  Medications  . nitrofurantoin, macrocrystal-monohydrate, (MACROBID) 100 MG capsule    Sig: Take 1 capsule (100 mg total) by mouth 2 (two) times daily. 1 po BID x 7days    Dispense:  14 capsule    Refill:  2  . medroxyPROGESTERone (PROVERA) 10 MG tablet    Sig: Take 1 tablet (10 mg total) by mouth daily.    Dispense:  30 tablet    Refill:  0  . ketorolac (TORADOL) injection 60 mg    Sig:

## 2015-09-12 ENCOUNTER — Telehealth: Payer: Self-pay | Admitting: *Deleted

## 2015-09-12 NOTE — Telephone Encounter (Signed)
Pt called to office asking for results from last visit.  Return call to pt, LM on VM to call office.

## 2015-09-17 ENCOUNTER — Ambulatory Visit: Payer: Medicaid Other | Admitting: Obstetrics

## 2015-09-17 ENCOUNTER — Telehealth: Payer: Self-pay | Admitting: *Deleted

## 2015-09-17 NOTE — Telephone Encounter (Signed)
Attempt to contact pt. LM with female to have pt call office.

## 2015-09-17 NOTE — Telephone Encounter (Signed)
error 

## 2015-09-18 NOTE — Telephone Encounter (Signed)
Patient has a follow up appointment 2/13.

## 2015-09-20 ENCOUNTER — Ambulatory Visit: Payer: Medicaid Other | Admitting: Obstetrics

## 2015-09-24 ENCOUNTER — Ambulatory Visit: Payer: Medicaid Other | Admitting: Obstetrics

## 2015-10-01 ENCOUNTER — Ambulatory Visit: Payer: Medicaid Other | Admitting: Obstetrics

## 2015-10-08 ENCOUNTER — Ambulatory Visit (INDEPENDENT_AMBULATORY_CARE_PROVIDER_SITE_OTHER): Payer: Medicaid Other | Admitting: Obstetrics

## 2015-10-08 ENCOUNTER — Encounter: Payer: Self-pay | Admitting: Obstetrics

## 2015-10-08 ENCOUNTER — Other Ambulatory Visit: Payer: Self-pay | Admitting: Obstetrics

## 2015-10-08 VITALS — BP 156/86 | HR 93 | Temp 97.8°F | Wt 345.0 lb

## 2015-10-08 DIAGNOSIS — R938 Abnormal findings on diagnostic imaging of other specified body structures: Secondary | ICD-10-CM

## 2015-10-08 DIAGNOSIS — R9389 Abnormal findings on diagnostic imaging of other specified body structures: Secondary | ICD-10-CM

## 2015-10-08 DIAGNOSIS — E669 Obesity, unspecified: Secondary | ICD-10-CM

## 2015-10-08 DIAGNOSIS — N950 Postmenopausal bleeding: Secondary | ICD-10-CM | POA: Diagnosis not present

## 2015-10-09 ENCOUNTER — Encounter: Payer: Self-pay | Admitting: Obstetrics

## 2015-10-09 NOTE — Progress Notes (Signed)
Patient ID: Yesenia Howard, female   DOB: [birth year], 60 y.o.   MRN: [id number]  Chief Complaint  Patient presents with  . Follow-up    Test Results     HPI Yesenia Howard is a 60 y.o. female.  Postmenopausal bleeding.  Thickened endometrium on ultrasound.  Presents for results of endometrial biopsy.  No further bleeding noted. HPI  Past Medical History  Diagnosis Date  . Ovarian cyst   . Retinal detachment   . Cataract   . Diabetes mellitus without complication ([place])   . Sciatica     Past Surgical History  Procedure Laterality Date  . Eye surgery    . Cesarean section    . Ovarian cyst removal    . Cataract extraction      Family History  Problem Relation Age of Onset  . Diabetes Mother   . Hypertension Mother   . Hypertension Sister     Social History Social History  Substance Use Topics  . Smoking status: Never Smoker   . Smokeless tobacco: None  . Alcohol Use: No    No Known Allergies  Current Outpatient Prescriptions  Medication Sig Dispense Refill  . Multiple Vitamin (MULTIVITAMIN WITH MINERALS) TABS tablet Take 1 tablet by mouth daily. Women's One A Day    . mometasone-formoterol (DULERA) 100-5 MCG/ACT AERO Inhale 2 puffs into the lungs 2 (two) times daily. (Patient not taking: Reported on 08/27/2015) 1 Inhaler 11   No current facility-administered medications for this visit.    Review of Systems Review of Systems Constitutional: negative for fatigue and weight loss Respiratory: negative for cough and wheezing Cardiovascular: negative for chest pain, fatigue and palpitations Gastrointestinal: negative for abdominal pain and change in bowel habits Genitourinary: positive for postmenopausal bleeding Integument/breast: negative for nipple discharge Musculoskeletal:negative for myalgias Neurological: negative for gait problems and tremors Behavioral/Psych: negative for abusive relationship, depression Endocrine: negative for temperature  intolerance     Blood pressure 156/86, pulse 93, temperature 97.8 F (36.6 C), weight 345 lb (156.491 kg).  Physical Exam Physical Exam: Deferred  100% of 10 min visit spent on counseling and coordination of care.   Data Reviewed Pathology  Assessment     Postmenopausal bleeding.  One episode.  Thickened endometrium on ultrasound. Normal endometrial biopsy.     Plan    Provera 10 mg po daily x 30 days. F/U in 1 month for repeat ultrasound.    No orders of the defined types were placed in this encounter.   No orders of the defined types were placed in this encounter.

## 2015-11-05 ENCOUNTER — Ambulatory Visit: Payer: Medicaid Other | Admitting: Obstetrics

## 2015-11-12 ENCOUNTER — Ambulatory Visit: Payer: Medicaid Other | Admitting: Obstetrics

## 2016-05-27 ENCOUNTER — Ambulatory Visit (INDEPENDENT_AMBULATORY_CARE_PROVIDER_SITE_OTHER): Payer: Medicaid Other | Admitting: Certified Nurse Midwife

## 2016-05-27 ENCOUNTER — Other Ambulatory Visit (HOSPITAL_COMMUNITY)
Admission: RE | Admit: 2016-05-27 | Discharge: 2016-05-27 | Disposition: A | Discharge status: Home / Self Care | Payer: Medicaid Other | Source: Ambulatory Visit | Attending: Certified Nurse Midwife | Admitting: Certified Nurse Midwife

## 2016-05-27 ENCOUNTER — Emergency Department (HOSPITAL_BASED_OUTPATIENT_CLINIC_OR_DEPARTMENT_OTHER)
Admission: EM | Admit: 2016-05-27 | Discharge: 2016-05-27 | Disposition: A | Discharge status: Home / Self Care | Payer: Medicaid Other | Attending: Emergency Medicine | Admitting: Emergency Medicine

## 2016-05-27 ENCOUNTER — Emergency Department (HOSPITAL_BASED_OUTPATIENT_CLINIC_OR_DEPARTMENT_OTHER): Payer: Medicaid Other

## 2016-05-27 ENCOUNTER — Encounter: Payer: Self-pay | Admitting: Certified Nurse Midwife

## 2016-05-27 VITALS — BP 135/83 | HR 93 | Ht 68.0 in | Wt 333.0 lb

## 2016-05-27 DIAGNOSIS — Z124 Encounter for screening for malignant neoplasm of cervix: Secondary | ICD-10-CM | POA: Diagnosis not present

## 2016-05-27 DIAGNOSIS — Y999 Unspecified external cause status: Secondary | ICD-10-CM | POA: Insufficient documentation

## 2016-05-27 DIAGNOSIS — Z79899 Other long term (current) drug therapy: Secondary | ICD-10-CM | POA: Diagnosis not present

## 2016-05-27 DIAGNOSIS — Y939 Activity, unspecified: Secondary | ICD-10-CM | POA: Insufficient documentation

## 2016-05-27 DIAGNOSIS — S46912A Strain of unspecified muscle, fascia and tendon at shoulder and upper arm level, left arm, initial encounter: Secondary | ICD-10-CM | POA: Diagnosis not present

## 2016-05-27 DIAGNOSIS — X58XXXA Exposure to other specified factors, initial encounter: Secondary | ICD-10-CM | POA: Diagnosis not present

## 2016-05-27 DIAGNOSIS — Z01419 Encounter for gynecological examination (general) (routine) without abnormal findings: Secondary | ICD-10-CM

## 2016-05-27 DIAGNOSIS — Z113 Encounter for screening for infections with a predominantly sexual mode of transmission: Secondary | ICD-10-CM

## 2016-05-27 DIAGNOSIS — E119 Type 2 diabetes mellitus without complications: Secondary | ICD-10-CM | POA: Insufficient documentation

## 2016-05-27 DIAGNOSIS — Z1151 Encounter for screening for human papillomavirus (HPV): Secondary | ICD-10-CM | POA: Insufficient documentation

## 2016-05-27 DIAGNOSIS — S4992XA Unspecified injury of left shoulder and upper arm, initial encounter: Secondary | ICD-10-CM | POA: Diagnosis present

## 2016-05-27 DIAGNOSIS — N760 Acute vaginitis: Secondary | ICD-10-CM | POA: Diagnosis present

## 2016-05-27 DIAGNOSIS — Y929 Unspecified place or not applicable: Secondary | ICD-10-CM | POA: Diagnosis not present

## 2016-05-27 DIAGNOSIS — M542 Cervicalgia: Secondary | ICD-10-CM

## 2016-05-27 LAB — CBC WITH DIFFERENTIAL/PLATELET
BASOS ABS: 0 10*3/uL (ref 0.0–0.1)
Basophils Relative: 0 %
EOS PCT: 1 %
Eosinophils Absolute: 0 10*3/uL (ref 0.0–0.7)
HEMATOCRIT: 37.1 % (ref 36.0–46.0)
Hemoglobin: 12.5 g/dL (ref 12.0–15.0)
LYMPHS ABS: 1.7 10*3/uL (ref 0.7–4.0)
LYMPHS PCT: 39 %
MCH: 29.8 pg (ref 26.0–34.0)
MCHC: 33.7 g/dL (ref 30.0–36.0)
MCV: 88.5 fL (ref 78.0–100.0)
MONO ABS: 0.4 10*3/uL (ref 0.1–1.0)
Monocytes Relative: 10 %
NEUTROS ABS: 2.3 10*3/uL (ref 1.7–7.7)
Neutrophils Relative %: 50 %
PLATELETS: 261 10*3/uL (ref 150–400)
RBC: 4.19 MIL/uL (ref 3.87–5.11)
RDW: 13.5 % (ref 11.5–15.5)
WBC: 4.5 10*3/uL (ref 4.0–10.5)

## 2016-05-27 LAB — COMPREHENSIVE METABOLIC PANEL
ALT: 11 U/L — AB (ref 14–54)
AST: 16 U/L (ref 15–41)
Albumin: 4.1 g/dL (ref 3.5–5.0)
Alkaline Phosphatase: 85 U/L (ref 38–126)
Anion gap: 6 (ref 5–15)
BUN: 14 mg/dL (ref 6–20)
CHLORIDE: 108 mmol/L (ref 101–111)
CO2: 25 mmol/L (ref 22–32)
CREATININE: 0.67 mg/dL (ref 0.44–1.00)
Calcium: 9.7 mg/dL (ref 8.9–10.3)
GFR calc Af Amer: >60 mL/min (ref >60)
GFR calc non Af Amer: >60 mL/min (ref >60)
Glucose, Bld: 116 mg/dL — ABNORMAL HIGH (ref 65–99)
POTASSIUM: 4.7 mmol/L (ref 3.5–5.1)
SODIUM: 139 mmol/L (ref 135–145)
Total Bilirubin: 0.7 mg/dL (ref 0.3–1.2)
Total Protein: 8 g/dL (ref 6.5–8.1)

## 2016-05-27 LAB — TROPONIN I

## 2016-05-27 MED ORDER — HYDROCODONE-ACETAMINOPHEN 5-325 MG PO TABS: 1.0000 | ORAL_TABLET | Freq: Four times a day (QID) | ORAL | 0 refills | Status: DC | PRN

## 2016-05-27 MED ORDER — PREDNISONE 10 MG PO TABS: 20.0000 mg | ORAL_TABLET | Freq: Two times a day (BID) | ORAL | 0 refills | Status: DC

## 2016-05-27 MED ORDER — KETOROLAC TROMETHAMINE 30 MG/ML IJ SOLN
30.0000 mg | Freq: Once | INTRAMUSCULAR | Status: AC
  Administered 2016-05-27: 30 mg via INTRAVENOUS
  Filled 2016-05-27: qty 1

## 2016-05-27 NOTE — Progress Notes (Signed)
Subjective:      Yesenia Howard is a 60 y.o. female here for a routine exam.  Current complaints: none, had endometrial biopsy about 6 months ago.  Has been doing well since.  Hot flashes for a long time, has night sweats.  Has used black cohash.  Friend/fiance' passed away recently.  Not currently sexually active.  Desires full STD screening exam.    Personal health questionnaire:  Is patient Ashkenazi Jewish, have a family history of breast and/or ovarian cancer: no Is there a family history of uterine cancer diagnosed at age < 66, gastrointestinal cancer, urinary tract cancer, family member who is a Field seismologist syndrome-associated carrier: no Is the patient overweight and hypertensive, family history of diabetes, personal history of gestational diabetes, preeclampsia or PCOS: yes Is patient over 67, have PCOS,  family history of premature CHD under age 58, diabetes, smoke, have hypertension or peripheral artery disease:  yes At any time, has a partner hit, kicked or otherwise hurt or frightened you?: no Over the past 2 weeks, have you felt down, depressed or hopeless?: no Over the past 2 weeks, have you felt little interest or pleasure in doing things?:no   Gynecologic History No LMP recorded. Patient is postmenopausal. Contraception: post menopausal status Last Pap: 10/07/15. Results were: normal Last mammogram: [facility], Dr. Lennox Grumbles in [place]. Results were: normal according to the patient.    Obstetric History OB History  Gravida Para Term Preterm AB Living  3 1 1   2 1   SAB TAB Ectopic Multiple Live Births    2     1    # Outcome Date GA Lbr Len/2nd Weight Sex Delivery Anes PTL Lv  3 TAB 1986          2 TAB 1984          1 Term 34    F CS-LVertical   LIV      Past Medical History:  Diagnosis Date  . Cataract   . Diabetes mellitus without complication ([place])   . Ovarian cyst   . Retinal detachment   . Sciatica     Past Surgical History:  Procedure Laterality  Date  . CATARACT EXTRACTION    . CESAREAN SECTION    . EYE SURGERY    . OVARIAN CYST REMOVAL       Current Outpatient Prescriptions:  Marland Kitchen  Multiple Vitamin (MULTIVITAMIN WITH MINERALS) TABS tablet, Take 1 tablet by mouth daily. Women's One A Day, Disp: , Rfl:  .  mometasone-formoterol (DULERA) 100-5 MCG/ACT AERO, Inhale 2 puffs into the lungs 2 (two) times daily. (Patient not taking: Reported on 05/27/2016), Disp: 1 Inhaler, Rfl: 11 No Known Allergies  Social History  Substance Use Topics  . Smoking status: Never Smoker  . Smokeless tobacco: Never Used  . Alcohol use No    Family History  Problem Relation Age of Onset  . Diabetes Mother   . Hypertension Mother   . Hypertension Sister       Review of Systems  Constitutional: negative for fatigue and weight loss Respiratory: negative for cough and wheezing Cardiovascular: negative for chest pain, fatigue and palpitations Gastrointestinal: negative for abdominal pain and change in bowel habits Musculoskeletal:negative for myalgias Neurological: negative for gait problems and tremors Behavioral/Psych: negative for abusive relationship, depression Endocrine: negative for temperature intolerance   Genitourinary:negative for abnormal menstrual periods, genital lesions, hot flashes, sexual problems and vaginal discharge Integument/breast: negative for breast lump, breast tenderness, nipple discharge and skin lesion(s)  Objective:       BP 135/83   Pulse 93   Ht 5\' 8"  (1.727 m)   Wt (!) 333 lb (151 kg)   BMI 50.63 kg/m  General:   alert  Skin:   no rash or abnormalities  Lungs:   clear to auscultation bilaterally  Heart:   regular rate and rhythm, S1, S2 normal, no murmur, click, rub or gallop  Breasts:   normal without suspicious masses, skin or nipple changes or axillary nodes  Abdomen:  normal findings: no organomegaly, soft, non-tender and no hernia  Pelvis:  External genitalia: normal general appearance Urinary  system: urethral meatus normal and bladder without fullness, nontender Vaginal: normal without tenderness, induration or masses Cervix: normal appearance Adnexa: normal bimanual exam Uterus: anteverted and non-tender, normal size   Lab Review Urine pregnancy test Labs reviewed yes Radiologic studies reviewed no  50% of 30 min visit spent on counseling and coordination of care.   Assessment:    Healthy female exam.    Plan:    Education reviewed: calcium supplements, depression evaluation, low fat, low cholesterol diet, safe sex/STD prevention, self breast exams, skin cancer screening and weight bearing exercise. Contraception: post menopausal status. Follow up in: 1 year.   No orders of the defined types were placed in this encounter.  Orders Placed This Encounter  Procedures  . RPR  . Hepatitis C antibody  . Hepatitis B surface antigen  . HIV antibody   Need to obtain previous records for mammograms Possible management options include:  Follow up as needed.

## 2016-05-27 NOTE — Discharge Instructions (Signed)
Prednisone as prescribed.  Hydrocodone as prescribed as needed for pain.  Follow-up with your primary Dr. if you're not improving in the next week, and return to the ER if symptoms significantly worsen or change.

## 2016-05-27 NOTE — ED Provider Notes (Signed)
[facility] DEPT [facility] Provider Note   CSN: [id number] Arrival date & time: 05/27/16  1724  By signing my name below, I, Soijett Blue, attest that this documentation has been prepared under the direction and in the presence of Veryl Speak, MD. Electronically Signed: Soijett Blue, ED Scribe. 05/27/16. 6:51 PM.   History   Chief Complaint Chief Complaint  Patient presents with  . Neck Pain  . Shoulder Pain    HPI Yesenia Howard is a 60 y.o. female with a PMHx of DM, who presents to the Emergency Department complaining of throbbing left sided neck pain onset 2 weeks. Pt reports that her left sided neck pain radiates to her left shoulder and underneath her left breast. Pt states that her overall pain is worsened with movement. Pt denies alleviating factors for her overall pain. Pt denies no recent injuries, fall, or trauma. Pt is having associated symptoms of left shoulder pain worsened with movement and pain underneath left breast. She notes that she has tried ibuprofen and OTC pain balm with no relief of her symptoms. She denies color change, wound, rash, SOB, cough, abdominal pain, weakness, and any other symptoms.    The history is provided by the patient. No language interpreter was used.    Past Medical History:  Diagnosis Date  . Cataract   . Diabetes mellitus without complication ([place])   . Ovarian cyst   . Retinal detachment   . Sciatica     Patient Active Problem List   Diagnosis Date Noted  . Poorly controlled intermittent asthma with acute exacerbation 10/18/2014  . Diabetes mellitus without complication ([place]) 0000000  . Elevated blood-pressure reading without diagnosis of hypertension 07/29/2014  . Bronchitis 07/28/2014  . SOB (shortness of breath) 07/28/2014    Past Surgical History:  Procedure Laterality Date  . CATARACT EXTRACTION    . CESAREAN SECTION    . EYE SURGERY    . OVARIAN CYST REMOVAL      OB History    Gravida Para Term Preterm AB  Living   3 1 1   2 1    SAB TAB Ectopic Multiple Live Births     2     1       Home Medications    Prior to Admission medications   Medication Sig Start Date End Date Taking? Authorizing Provider  mometasone-formoterol (DULERA) 100-5 MCG/ACT AERO Inhale 2 puffs into the lungs 2 (two) times daily. Patient not taking: Reported on 05/27/2016 10/17/14   Tanda Rockers, MD  Multiple Vitamin (MULTIVITAMIN WITH MINERALS) TABS tablet Take 1 tablet by mouth daily. Women's One A Day    Historical Provider, MD    Family History Family History  Problem Relation Age of Onset  . Diabetes Mother   . Hypertension Mother   . Hypertension Sister     Social History Social History  Substance Use Topics  . Smoking status: Never Smoker  . Smokeless tobacco: Never Used  . Alcohol use No     Allergies   Review of patient's allergies indicates no known allergies.   Review of Systems Review of Systems  All other systems reviewed and are negative.    Physical Exam Updated Vital Signs BP 155/81 (BP Location: Right Arm)   Pulse 80   Temp 98.3 F (36.8 C) (Oral)   Resp 18   SpO2 97%   Physical Exam  Constitutional: She is oriented to person, place, and time. She appears well-developed and well-nourished. No distress.  HENT:  Head: Normocephalic and atraumatic.  Right Ear: Hearing normal.  Left Ear: Hearing normal.  Nose: Nose normal.  Mouth/Throat: Oropharynx is clear and moist and mucous membranes are normal.  Eyes: Conjunctivae and EOM are normal. Pupils are equal, round, and reactive to light.  Neck: Normal range of motion. Neck supple.  Cardiovascular: Normal rate, regular rhythm, S1 normal, S2 normal and normal heart sounds.  Exam reveals no gallop and no friction rub.   No murmur heard. Pulmonary/Chest: Effort normal and breath sounds normal. No respiratory distress. She has no wheezes. She has no rales. She exhibits no tenderness.  Abdominal: Soft. Normal appearance and bowel  sounds are normal. There is no hepatosplenomegaly. There is no tenderness. There is no rebound, no guarding, no tenderness at McBurney's point and negative Murphy's sign. No hernia.  Musculoskeletal: Normal range of motion.  Some discomfort with ROM of left shoulder. PMS intact to left arm.   Neurological: She is alert and oriented to person, place, and time. She has normal strength. No cranial nerve deficit or sensory deficit. Coordination normal. GCS eye subscore is 4. GCS verbal subscore is 5. GCS motor subscore is 6.  Skin: Skin is warm, dry and intact. No rash noted. No cyanosis.  Psychiatric: She has a normal mood and affect. Her speech is normal and behavior is normal. Thought content normal.  Nursing note and vitals reviewed.    ED Treatments / Results  DIAGNOSTIC STUDIES: Oxygen Saturation is 100% on RA, nl by my interpretation.    COORDINATION OF CARE: 6:40 PM Discussed treatment plan with pt at bedside which includes CXR, labs, EKG, and pt agreed to plan.   Labs (all labs ordered are listed, but only abnormal results are displayed) Labs Reviewed  COMPREHENSIVE METABOLIC PANEL - Abnormal; Notable for the following:       Result Value   Glucose, Bld 116 (*)    ALT 11 (*)    All other components within normal limits  TROPONIN I  CBC WITH DIFFERENTIAL/PLATELET    EKG  EKG Interpretation  Date/Time:  Tuesday May 27 2016 19:05:38 EDT Ventricular Rate:  76 PR Interval:    QRS Duration: 105 QT Interval:  379 QTC Calculation: 427 R Axis:   -25 Text Interpretation:  Sinus rhythm Abnormal R-wave progression, early transition Left ventricular hypertrophy Confirmed by Stark Jock  MD, Jaaziah Schulke ([id number]) on 05/27/2016 8:16:41 PM       Radiology Dg Chest 2 View  Result Date: 05/27/2016 CLINICAL DATA:  Chest pain EXAM: CHEST  2 VIEW COMPARISON:  07/28/2014 chest radiograph. FINDINGS: Stable cardiomediastinal silhouette with normal heart size. No pneumothorax. No pleural  effusion. Lungs appear clear, with no acute consolidative airspace disease and no pulmonary edema. IMPRESSION: No active cardiopulmonary disease. Electronically Signed   By: Ilona Sorrel M.D.   On: 05/27/2016 20:20   Dg Cervical Spine 2 Or 3 Views  Result Date: 05/27/2016 CLINICAL DATA:  Left neck pain radiating to the left chest. No reported injury. EXAM: CERVICAL SPINE - 2-3 VIEW COMPARISON:  05/14/2014 cervical spine MRI. FINDINGS: On the lateral view the cervical spine is visualized to the level of C7-T1. Straightening of the cervical spine. Pre-vertebral soft tissues are within normal limits. No fracture is detected in the cervical spine. Dens is well positioned between the lateral masses of C1. There is mild to moderate degenerative disc disease at C5-6, stable. No cervical spine subluxation. Minimal bilateral facet arthropathy. No aggressive-appearing focal osseous lesions. IMPRESSION: 1. Stable mild-to-moderate degenerative disc disease  at C5-6. 2. Minimal facet arthropathy bilaterally. 3. Straightening of the cervical spine, usually due to positioning and/or muscle spasm. Electronically Signed   By: Ilona Sorrel M.D.   On: 05/27/2016 20:23    Procedures Procedures (including critical care time)  Medications Ordered in ED Medications  ketorolac (TORADOL) 30 MG/ML injection 30 mg (30 mg Intravenous Given 05/27/16 1907)     Initial Impression / Assessment and Plan / ED Course  I have reviewed the triage vital signs and the nursing notes.  Pertinent labs & imaging results that were available during my care of the patient were reviewed by me and considered in my medical decision making (see chart for details).  Clinical Course    Patient presents with complaints of pain in the left anterior chest, shoulder blade, shoulder, left neck that has been ongoing for several days. This began in the absence of any injury or trauma. Her history is somewhat vague. Workup reveals no evidence for  acute process. There is no evidence for cardiac etiology and chest x-ray is clear. She does have findings of degenerative disc disease in her cervical spine. This may well be the cause of her discomfort. She will be treated with steroids, pain medication, and follow up with her primary Dr. if not improving.  Final Clinical Impressions(s) / ED Diagnoses   Final diagnoses:  Neck pain    New Prescriptions New Prescriptions   No medications on file    I personally performed the services described in this documentation, which was scribed in my presence. The recorded information has been reviewed and is accurate.         Veryl Speak, MD 05/27/16 2036

## 2016-05-27 NOTE — ED Notes (Signed)
Patient transported to X-ray 

## 2016-05-27 NOTE — ED Triage Notes (Signed)
Pain in her neck and left shoulder x 2 weeks after laying the wrong way in bed. She is able to turn her head side to side but worse when she turns to the left.

## 2016-05-27 NOTE — ED Notes (Signed)
MD at bedside. 

## 2016-05-28 LAB — HEPATITIS C ANTIBODY: Hep C Virus Ab: <0.1 s/co ratio (ref 0.0–0.9)

## 2016-05-28 LAB — RPR: RPR Ser Ql: NONREACTIVE

## 2016-05-28 LAB — HIV ANTIBODY (ROUTINE TESTING W REFLEX): HIV Screen 4th Generation wRfx: NONREACTIVE

## 2016-05-28 LAB — HEPATITIS B SURFACE ANTIGEN: HEP B S AG: NEGATIVE

## 2016-05-29 LAB — CERVICOVAGINAL ANCILLARY ONLY
Chlamydia: NEGATIVE
NEISSERIA GONORRHEA: NEGATIVE
Trichomonas: NEGATIVE

## 2016-05-29 LAB — CYTOLOGY - PAP

## 2016-06-02 LAB — CERVICOVAGINAL ANCILLARY ONLY
Bacterial vaginitis: NEGATIVE
Candida vaginitis: NEGATIVE

## 2016-08-04 ENCOUNTER — Telehealth: Payer: Self-pay

## 2016-08-04 ENCOUNTER — Telehealth: Payer: Self-pay | Admitting: *Deleted

## 2016-08-04 ENCOUNTER — Other Ambulatory Visit: Payer: Self-pay | Admitting: Obstetrics

## 2016-08-04 DIAGNOSIS — N3000 Acute cystitis without hematuria: Secondary | ICD-10-CM

## 2016-08-04 MED ORDER — NITROFURANTOIN MONOHYD MACRO 100 MG PO CAPS: 100.0000 mg | ORAL_CAPSULE | Freq: Two times a day (BID) | ORAL | 2 refills | Status: DC

## 2016-08-04 NOTE — Telephone Encounter (Signed)
Spoke with pt and advised of rx sent by provider

## 2016-08-04 NOTE — Telephone Encounter (Signed)
Pt called stating "she knows she has a bladder infection" she came in for a annual back in Oct w/ Ernst Bowler and wants to know if Dr Jodi Mourning would send her a script for it, she states she normally sees Jodi Mourning but he was not available that day.. She requests it be sent to [facility] on [address] in [place], please advise.Marland KitchenMarland Kitchen

## 2016-10-01 ENCOUNTER — Encounter (HOSPITAL_BASED_OUTPATIENT_CLINIC_OR_DEPARTMENT_OTHER): Payer: Self-pay

## 2016-10-01 ENCOUNTER — Emergency Department (HOSPITAL_BASED_OUTPATIENT_CLINIC_OR_DEPARTMENT_OTHER)
Admission: EM | Admit: 2016-10-01 | Discharge: 2016-10-01 | Disposition: A | Discharge status: Home / Self Care | Payer: Medicaid Other | Attending: Emergency Medicine | Admitting: Emergency Medicine

## 2016-10-01 ENCOUNTER — Emergency Department (HOSPITAL_BASED_OUTPATIENT_CLINIC_OR_DEPARTMENT_OTHER): Payer: Medicaid Other

## 2016-10-01 DIAGNOSIS — R0789 Other chest pain: Secondary | ICD-10-CM

## 2016-10-01 DIAGNOSIS — E119 Type 2 diabetes mellitus without complications: Secondary | ICD-10-CM | POA: Insufficient documentation

## 2016-10-01 LAB — BASIC METABOLIC PANEL
ANION GAP: 4 — AB (ref 5–15)
BUN: 17 mg/dL (ref 6–20)
CALCIUM: 9.1 mg/dL (ref 8.9–10.3)
CO2: 26 mmol/L (ref 22–32)
Chloride: 107 mmol/L (ref 101–111)
Creatinine, Ser: 0.79 mg/dL (ref 0.44–1.00)
GLUCOSE: 125 mg/dL — AB (ref 65–99)
POTASSIUM: 3.9 mmol/L (ref 3.5–5.1)
SODIUM: 137 mmol/L (ref 135–145)

## 2016-10-01 LAB — CBC WITH DIFFERENTIAL/PLATELET
Basophils Absolute: 0 10*3/uL (ref 0.0–0.1)
Basophils Relative: 0 %
EOS ABS: 0 10*3/uL (ref 0.0–0.7)
EOS PCT: 1 %
HCT: 36 % (ref 36.0–46.0)
HEMOGLOBIN: 11.9 g/dL — AB (ref 12.0–15.0)
LYMPHS ABS: 1.1 10*3/uL (ref 0.7–4.0)
LYMPHS PCT: 21 %
MCH: 29.9 pg (ref 26.0–34.0)
MCHC: 33.1 g/dL (ref 30.0–36.0)
MCV: 90.5 fL (ref 78.0–100.0)
MONOS PCT: 8 %
Monocytes Absolute: 0.4 10*3/uL (ref 0.1–1.0)
Neutro Abs: 3.7 10*3/uL (ref 1.7–7.7)
Neutrophils Relative %: 70 %
PLATELETS: 257 10*3/uL (ref 150–400)
RBC: 3.98 MIL/uL (ref 3.87–5.11)
RDW: 12.5 % (ref 11.5–15.5)
WBC: 5.2 10*3/uL (ref 4.0–10.5)

## 2016-10-01 LAB — TROPONIN I

## 2016-10-01 MED ORDER — TRAMADOL HCL 50 MG PO TABS: 50.0000 mg | ORAL_TABLET | Freq: Four times a day (QID) | ORAL | 0 refills | Status: DC | PRN

## 2016-10-01 MED ORDER — TRAMADOL HCL 50 MG PO TABS
50.0000 mg | ORAL_TABLET | Freq: Once | ORAL | Status: AC
Start: 2016-10-01 — End: 2016-10-01
  Administered 2016-10-01: 50 mg via ORAL
  Filled 2016-10-01: qty 1

## 2016-10-01 NOTE — ED Triage Notes (Signed)
C/o CP x over 1 month-seen by cards 08/25/16 for same c/o-NAD-steady gait

## 2016-10-01 NOTE — Discharge Instructions (Signed)
Return here as needed.  Follow-up with her primary doctor and cardiologist.  Return here as needed

## 2016-10-01 NOTE — ED Provider Notes (Signed)
[facility] DEPT [facility] Provider Note   CSN: [id number] Arrival date & time: 10/01/16  1650   By signing my name below, I, Charolotte Eke, attest that this documentation has been prepared under the direction and in the presence of [employer], PA-C. Electronically Signed: Charolotte Eke, Scribe. 10/01/16. 7:29 PM.   History   Chief Complaint Chief Complaint  Patient presents with  . Chest Pain    HPI Yesenia Howard is a 61 y.o. female who presents to the Emergency Department complaining of gradual onset, waxing and waning, intermittent chest pain that started 3 weeks ago. Pt has associated left arm pain. Pt take tylenol with momentary relief. Pt has taken pain pill from her sister with effect. Pt has a cardiologist at [facility] in [place] who tells her that she does not have cardiac issues. Pt Pt denies fever, cough, SOB. NKDA.   The history is provided by the patient. No language interpreter was used.    Past Medical History:  Diagnosis Date  . Cataract   . Diabetes mellitus without complication ([place])    pt denies-states BS were elevated inthe past r/t prednisone-no DM meds  . Ovarian cyst   . Retinal detachment   . Sciatica     Patient Active Problem List   Diagnosis Date Noted  . Poorly controlled intermittent asthma with acute exacerbation 10/18/2014  . Diabetes mellitus without complication ([place]) 0000000  . Elevated blood-pressure reading without diagnosis of hypertension 07/29/2014  . Bronchitis 07/28/2014  . SOB (shortness of breath) 07/28/2014    Past Surgical History:  Procedure Laterality Date  . CATARACT EXTRACTION    . CESAREAN SECTION    . EYE SURGERY    . OVARIAN CYST REMOVAL      OB History    Gravida Para Term Preterm AB Living   3 1 1   2 1    SAB TAB Ectopic Multiple Live Births     2     1       Home Medications    Prior to Admission medications   Not on File    Family History Family History  Problem Relation Age of Onset  . Diabetes  Mother   . Hypertension Mother   . Hypertension Sister     Social History Social History  Substance Use Topics  . Smoking status: Never Smoker  . Smokeless tobacco: Never Used  . Alcohol use No     Allergies   Patient has no known allergies.   Review of Systems Review of Systems  Constitutional: Negative for fever.  Respiratory: Negative for cough and shortness of breath.   Cardiovascular: Positive for chest pain.  Gastrointestinal: Negative for abdominal pain.  Musculoskeletal: Positive for arthralgias and myalgias.       Arm pain.    A complete 10 system review of systems was obtained and all systems are negative except as noted in the HPI and PMH.    Physical Exam Updated Vital Signs BP 175/91 (BP Location: Left Arm)   Pulse 98   Temp 98.4 F (36.9 C) (Oral)   Resp 20   Ht 5\' 9"  (1.753 m)   Wt (!) 339 lb (153.8 kg)   SpO2 100%   BMI 50.06 kg/m   Physical Exam  Constitutional: She is oriented to person, place, and time. She appears well-developed and well-nourished. No distress.  HENT:  Head: Normocephalic and atraumatic.  Mouth/Throat: Oropharynx is clear and moist.  Eyes: Pupils are equal, round, and reactive to  light.  Neck: Normal range of motion. Neck supple.  Cardiovascular: Normal rate, regular rhythm and normal heart sounds.  Exam reveals no gallop and no friction rub.   No murmur heard. Pulmonary/Chest: Effort normal and breath sounds normal. No respiratory distress. She has no wheezes. She exhibits no tenderness.  Abdominal: Soft. Bowel sounds are normal. She exhibits no distension. There is no tenderness.  Neurological: She is alert and oriented to person, place, and time. She exhibits normal muscle tone. Coordination normal.  Skin: Skin is warm and dry. No rash noted. No erythema.  Psychiatric: She has a normal mood and affect. Her behavior is normal.  Nursing note and vitals reviewed.    ED Treatments / Results   DIAGNOSTIC  STUDIES: Oxygen Saturation is 100% on room air, normal by my interpretation.    COORDINATION OF CARE: 6:15 PM Discussed treatment plan with pt at bedside and pt agreed to plan.  Labs (all labs ordered are listed, but only abnormal results are displayed) Labs Reviewed  CBC WITH DIFFERENTIAL/PLATELET - Abnormal; Notable for the following:       Result Value   Hemoglobin 11.9 (*)    All other components within normal limits  BASIC METABOLIC PANEL - Abnormal; Notable for the following:    Glucose, Bld 125 (*)    Anion gap 4 (*)    All other components within normal limits  TROPONIN I    EKG  EKG Interpretation  Date/Time:  Wednesday October 01 2016 17:13:30 EST Ventricular Rate:  88 PR Interval:    QRS Duration: 104 QT Interval:  367 QTC Calculation: 444 R Axis:   -14 Text Interpretation:  Sinus rhythm No significant change since last tracing Confirmed by Canary Brim  MD, MARTHA [phone]) on 10/01/2016 5:17:38 PM       Radiology No results found.  Procedures Procedures (including critical care time)  Medications Ordered in ED Medications - No data to display   Initial Impression / Assessment and Plan / ED Course  I have reviewed the triage vital signs and the nursing notes.  Pertinent labs & imaging results that were available during my care of the patient were reviewed by me and considered in my medical decision making (see chart for details).   patient's had greater than a month with a constant chest pain.  Pain seems involve the back as well as feel that this is chest wall thoracic pain.  Patient be referred to her primary doctor, told to return here as needed and also advised to follow with her cardiologist as needed.  She states she seen in several times for this in the feel it is not cardiac related  Final Clinical Impressions(s) / ED Diagnoses   Final diagnoses:  None    New Prescriptions New Prescriptions   No medications on file    I personally performed  the services described in this documentation, which was scribed in my presence. The recorded information has been reviewed and is accurate.    Dalia Heading, PA-C 10/01/16 [place], MD 10/01/16 1950

## 2017-02-13 ENCOUNTER — Encounter (HOSPITAL_BASED_OUTPATIENT_CLINIC_OR_DEPARTMENT_OTHER): Payer: Self-pay | Admitting: *Deleted

## 2017-02-13 ENCOUNTER — Emergency Department (HOSPITAL_BASED_OUTPATIENT_CLINIC_OR_DEPARTMENT_OTHER)
Admission: EM | Admit: 2017-02-13 | Discharge: 2017-02-13 | Disposition: A | Discharge status: Home / Self Care | Payer: Medicaid Other | Attending: Emergency Medicine | Admitting: Emergency Medicine

## 2017-02-13 DIAGNOSIS — E119 Type 2 diabetes mellitus without complications: Secondary | ICD-10-CM | POA: Diagnosis not present

## 2017-02-13 DIAGNOSIS — J45909 Unspecified asthma, uncomplicated: Secondary | ICD-10-CM | POA: Diagnosis not present

## 2017-02-13 DIAGNOSIS — M5431 Sciatica, right side: Secondary | ICD-10-CM | POA: Diagnosis not present

## 2017-02-13 DIAGNOSIS — M79604 Pain in right leg: Secondary | ICD-10-CM | POA: Diagnosis present

## 2017-02-13 LAB — BASIC METABOLIC PANEL
Anion gap: 7 (ref 5–15)
BUN: 9 mg/dL (ref 6–20)
CO2: 27 mmol/L (ref 22–32)
CREATININE: 0.71 mg/dL (ref 0.44–1.00)
Calcium: 9 mg/dL (ref 8.9–10.3)
Chloride: 108 mmol/L (ref 101–111)
Glucose, Bld: 161 mg/dL — ABNORMAL HIGH (ref 65–99)
Potassium: 3.6 mmol/L (ref 3.5–5.1)
SODIUM: 142 mmol/L (ref 135–145)

## 2017-02-13 MED ORDER — DEXAMETHASONE SODIUM PHOSPHATE 10 MG/ML IJ SOLN
10.0000 mg | Freq: Once | INTRAMUSCULAR | Status: AC
  Administered 2017-02-13: 10 mg via INTRAMUSCULAR
  Filled 2017-02-13: qty 1

## 2017-02-13 MED ORDER — KETOROLAC TROMETHAMINE 30 MG/ML IJ SOLN
30.0000 mg | Freq: Once | INTRAMUSCULAR | Status: AC
  Administered 2017-02-13: 30 mg via INTRAMUSCULAR
  Filled 2017-02-13: qty 1

## 2017-02-13 NOTE — ED Triage Notes (Signed)
Pain in her right buttocks that radiates down her right leg. Hx of sciatica.

## 2017-02-13 NOTE — Discharge Instructions (Signed)
Please read instructions below.  You can take advil every 6 hours for pain.  Apply ice to your back for 20 minutes at a time. Follow up with your spine doctor. Return to ER if NEW numbness or tingling in your arms or legs, inability to urinate, inability to hold your bowels, or weakness in your extremities.

## 2017-02-13 NOTE — ED Provider Notes (Signed)
[facility] DEPT [facility] Provider Note   CSN: [id number] Arrival date & time: 02/13/17  2035  By signing my name below, I, Yesenia Howard, attest that this documentation has been prepared under the direction and in the presence of non-physician practitioner, Russo, [place], PA-C. Electronically Signed: Theresia Howard, ED Scribe. 02/13/17. 9:22 PM.  History   Chief Complaint Chief Complaint  Patient presents with  . Leg Pain   The history is provided by the patient. No language interpreter was used.   HPI Comments: Yesenia Howard is a 61 y.o. female with a PMHx of sciatica and DM, who presents to the Emergency Department complaining of constant, moderate pain in her right buttocks that radiates down her right leg that became worse 4 days ago. Pt states pain is exacerbated by movement. Pt takes Tramadol and has gotten injections in her back with no relief. Pt states a Toradol shot is the only thing that has helped her pain. Pt denies urinary/bowel incontinence, Saddle paresthesias, numbness or tingling down extremities, starting weakness or any other complaints at this time.  Past Medical History:  Diagnosis Date  . Cataract   . Diabetes mellitus without complication ([place])    pt denies-states BS were elevated inthe past r/t prednisone-no DM meds  . Ovarian cyst   . Retinal detachment   . Sciatica     Patient Active Problem List   Diagnosis Date Noted  . Poorly controlled intermittent asthma with acute exacerbation 10/18/2014  . Diabetes mellitus without complication ([place]) 32/95/1884  . Elevated blood-pressure reading without diagnosis of hypertension 07/29/2014  . Bronchitis 07/28/2014  . SOB (shortness of breath) 07/28/2014    Past Surgical History:  Procedure Laterality Date  . CATARACT EXTRACTION    . CESAREAN SECTION    . EYE SURGERY    . OVARIAN CYST REMOVAL      OB History    Gravida Para Term Preterm AB Living   3 1 1   2 1    SAB TAB Ectopic Multiple Live  Births     2     1       Home Medications    Prior to Admission medications   Medication Sig Start Date End Date Taking? Authorizing Provider  traMADol (ULTRAM) 50 MG tablet Take 1 tablet (50 mg total) by mouth every 6 (six) hours as needed for severe pain. 10/01/16   Dalia Heading, PA-C    Family History Family History  Problem Relation Age of Onset  . Diabetes Mother   . Hypertension Mother   . Hypertension Sister     Social History Social History  Substance Use Topics  . Smoking status: Never Smoker  . Smokeless tobacco: Never Used  . Alcohol use No     Allergies   Patient has no known allergies.   Review of Systems Review of Systems  Gastrointestinal:       No bowel incontinence  Genitourinary: Negative for difficulty urinating.  Musculoskeletal: Positive for back pain and myalgias.  Neurological: Negative for weakness and numbness.     Physical Exam Updated Vital Signs BP (!) 149/76 (BP Location: Left Arm)   Pulse 86   Temp 98.5 F (36.9 C) (Oral)   Resp 18   Ht 5\' 8"  (1.727 m)   Wt (!) 151 kg (332 lb 14.3 oz)   SpO2 98%   BMI 50.62 kg/m   Physical Exam  Constitutional: She is oriented to person, place, and time. She appears well-developed and well-nourished.  Morbidly  obese.   HENT:  Head: Normocephalic and atraumatic.  Eyes: Conjunctivae are normal.  Neck: Normal range of motion.  Cardiovascular: Normal rate.   Pulmonary/Chest: Effort normal.  Musculoskeletal: Normal range of motion. She exhibits no edema or tenderness.  No spinal or paraspinal tenderness. No bony step offs or deformities. Moving all extremities.   Neurological: She is alert and oriented to person, place, and time. No sensory deficit. She exhibits normal muscle tone.  5/5 strength BUE and BLE. Unassisted gait.   Psychiatric: She has a normal mood and affect. Her behavior is normal.  Nursing note and vitals reviewed.    ED Treatments / Results  DIAGNOSTIC  STUDIES: Oxygen Saturation is 97% on RA, normal by my interpretation.   COORDINATION OF CARE: 9:16 PM-Discussed next steps with pt including Pain management and prednisone. Pt verbalized understanding and is agreeable with the plan.   Labs (all labs ordered are listed, but only abnormal results are displayed) Labs Reviewed  BASIC METABOLIC PANEL - Abnormal; Notable for the following:       Result Value   Glucose, Bld 161 (*)    All other components within normal limits    EKG  EKG Interpretation None       Radiology No results found.  Procedures Procedures (including critical care time)  Medications Ordered in ED Medications  ketorolac (TORADOL) 30 MG/ML injection 30 mg (30 mg Intramuscular Given 02/13/17 2222)  dexamethasone (DECADRON) injection 10 mg (10 mg Intramuscular Given 02/13/17 2222)     Initial Impression / Assessment and Plan / ED Course  I have reviewed the triage vital signs and the nursing notes.  Pertinent labs & imaging results that were available during my care of the patient were reviewed by me and considered in my medical decision making (see chart for details).     Patient presenting with sciatica. Normal neurological exam, no evidence of urinary incontinence or retention, pain is consistently reproducible.  Patient can walk but states is painful.  No loss of bowel or bladder control.  No concern for cauda equina. Pain treated here in the department with adequate improvement. RICE protocol and pain medicine indicated and discussed with patient. She is to follow-up with orthopedic specialist. I have also discussed reasons to return immediately to the ER.  Patient expresses understanding and agrees with plan.  Discussed results, findings, treatment and follow up. Patient advised of return precautions. Patient verbalized understanding and agreed with plan.  Final Clinical Impressions(s) / ED Diagnoses   Final diagnoses:  Sciatica of right side     New Prescriptions Discharge Medication List as of 02/13/2017 10:25 PM     I personally performed the services described in this documentation, which was scribed in my presence. The recorded information has been reviewed and is accurate.    Russo, [place] N, PA-C 02/14/17 Loura Pardon    Duffy Bruce, MD 02/14/17 1400

## 2017-03-01 ENCOUNTER — Emergency Department (HOSPITAL_BASED_OUTPATIENT_CLINIC_OR_DEPARTMENT_OTHER): Payer: Medicaid Other

## 2017-03-01 ENCOUNTER — Encounter (HOSPITAL_BASED_OUTPATIENT_CLINIC_OR_DEPARTMENT_OTHER): Payer: Self-pay | Admitting: Emergency Medicine

## 2017-03-01 ENCOUNTER — Emergency Department (HOSPITAL_BASED_OUTPATIENT_CLINIC_OR_DEPARTMENT_OTHER)
Admission: EM | Admit: 2017-03-01 | Discharge: 2017-03-01 | Disposition: A | Discharge status: Home / Self Care | Payer: Medicaid Other | Attending: Emergency Medicine | Admitting: Emergency Medicine

## 2017-03-01 DIAGNOSIS — E119 Type 2 diabetes mellitus without complications: Secondary | ICD-10-CM | POA: Insufficient documentation

## 2017-03-01 DIAGNOSIS — J45909 Unspecified asthma, uncomplicated: Secondary | ICD-10-CM | POA: Diagnosis not present

## 2017-03-01 DIAGNOSIS — N390 Urinary tract infection, site not specified: Secondary | ICD-10-CM | POA: Diagnosis not present

## 2017-03-01 DIAGNOSIS — R531 Weakness: Secondary | ICD-10-CM | POA: Insufficient documentation

## 2017-03-01 DIAGNOSIS — R110 Nausea: Secondary | ICD-10-CM | POA: Diagnosis present

## 2017-03-01 DIAGNOSIS — N3000 Acute cystitis without hematuria: Secondary | ICD-10-CM

## 2017-03-01 LAB — URINALYSIS, ROUTINE W REFLEX MICROSCOPIC
Bilirubin Urine: NEGATIVE
Glucose, UA: NEGATIVE mg/dL
Hgb urine dipstick: NEGATIVE
Ketones, ur: NEGATIVE mg/dL
NITRITE: POSITIVE — AB
PH: 7 (ref 5.0–8.0)
Protein, ur: NEGATIVE mg/dL
Specific Gravity, Urine: 1.02 (ref 1.005–1.030)

## 2017-03-01 LAB — CBC WITH DIFFERENTIAL/PLATELET
BASOS ABS: 0 10*3/uL (ref 0.0–0.1)
BASOS PCT: 0 %
EOS ABS: 0 10*3/uL (ref 0.0–0.7)
EOS PCT: 1 %
HCT: 42 % (ref 36.0–46.0)
Hemoglobin: 14.2 g/dL (ref 12.0–15.0)
LYMPHS ABS: 1.5 10*3/uL (ref 0.7–4.0)
Lymphocytes Relative: 37 %
MCH: 29.9 pg (ref 26.0–34.0)
MCHC: 33.8 g/dL (ref 30.0–36.0)
MCV: 88.4 fL (ref 78.0–100.0)
Monocytes Absolute: 0.4 10*3/uL (ref 0.1–1.0)
Monocytes Relative: 9 %
NEUTROS PCT: 53 %
Neutro Abs: 2.1 10*3/uL (ref 1.7–7.7)
PLATELETS: 279 10*3/uL (ref 150–400)
RBC: 4.75 MIL/uL (ref 3.87–5.11)
RDW: 12.9 % (ref 11.5–15.5)
WBC: 4.1 10*3/uL (ref 4.0–10.5)

## 2017-03-01 LAB — BASIC METABOLIC PANEL
Anion gap: 9 (ref 5–15)
BUN: 12 mg/dL (ref 6–20)
CALCIUM: 9.7 mg/dL (ref 8.9–10.3)
CO2: 27 mmol/L (ref 22–32)
CREATININE: 0.81 mg/dL (ref 0.44–1.00)
Chloride: 105 mmol/L (ref 101–111)
Glucose, Bld: 123 mg/dL — ABNORMAL HIGH (ref 65–99)
Potassium: 3.6 mmol/L (ref 3.5–5.1)
SODIUM: 141 mmol/L (ref 135–145)

## 2017-03-01 LAB — URINALYSIS, MICROSCOPIC (REFLEX): RBC / HPF: NONE SEEN RBC/hpf (ref 0–5)

## 2017-03-01 LAB — TROPONIN I

## 2017-03-01 MED ORDER — CEPHALEXIN 500 MG PO CAPS: 500.0000 mg | ORAL_CAPSULE | Freq: Four times a day (QID) | ORAL | 0 refills | Status: DC

## 2017-03-01 NOTE — ED Notes (Signed)
Pt ambulated to restroom to attempt urine sample.

## 2017-03-01 NOTE — ED Triage Notes (Signed)
Pt presents with c/o of nausea and "not feeling good" for a week. Pt was here last week sciatica pain. PT thinks she has bladder infection too. Pt reports burning and odor with urination.

## 2017-03-01 NOTE — Discharge Instructions (Signed)
Keflex as prescribed.  Follow-up with your primary Dr. if symptoms are not improving in the next 3-4 days, and return to the ER if symptoms significantly worsen or change.

## 2017-03-01 NOTE — ED Provider Notes (Signed)
[facility] Provider Note   CSN: [id number] Arrival date & time: 03/01/17  1414     History   Chief Complaint Chief Complaint  Patient presents with  . Nausea    HPI Yesenia Howard is a 61 y.o. female.  Patient is a 61 year old female with past medical history of obesity, bronchitis presenting with complaints of weakness, nausea, and generalized malaise that has been ongoing for the past week. She does report some cough and some urinary frequency, burning, and malodorous urine. She denies any fevers or chills. She denies any chest pain, difficulty breathing, or ill contacts.   The history is provided by the patient.    Past Medical History:  Diagnosis Date  . Cataract   . Diabetes mellitus without complication ([place])    pt denies-states BS were elevated inthe past r/t prednisone-no DM meds  . Ovarian cyst   . Retinal detachment   . Sciatica     Patient Active Problem List   Diagnosis Date Noted  . Poorly controlled intermittent asthma with acute exacerbation 10/18/2014  . Diabetes mellitus without complication ([place]) 42/59/5638  . Elevated blood-pressure reading without diagnosis of hypertension 07/29/2014  . Bronchitis 07/28/2014  . SOB (shortness of breath) 07/28/2014    Past Surgical History:  Procedure Laterality Date  . CATARACT EXTRACTION    . CESAREAN SECTION    . EYE SURGERY    . OVARIAN CYST REMOVAL      OB History    Gravida Para Term Preterm AB Living   3 1 1   2 1    SAB TAB Ectopic Multiple Live Births     2     1       Home Medications    Prior to Admission medications   Medication Sig Start Date End Date Taking? Authorizing Provider  traMADol (ULTRAM) 50 MG tablet Take 1 tablet (50 mg total) by mouth every 6 (six) hours as needed for severe pain. 10/01/16   Dalia Heading, PA-C    Family History Family History  Problem Relation Age of Onset  . Diabetes Mother   . Hypertension Mother   . Hypertension Sister      Social History Social History  Substance Use Topics  . Smoking status: Never Smoker  . Smokeless tobacco: Never Used  . Alcohol use No     Allergies   Patient has no known allergies.   Review of Systems Review of Systems  All other systems reviewed and are negative.    Physical Exam Updated Vital Signs BP (!) 177/76 (BP Location: Right Arm)   Pulse 83   Temp 98.7 F (37.1 C) (Oral)   Resp 18   Ht 5\' 9"  (1.753 m)   Wt (!) 149.7 kg (330 lb)   SpO2 100%   BMI 48.73 kg/m   Physical Exam  Constitutional: She is oriented to person, place, and time. She appears well-developed and well-nourished. No distress.  HENT:  Head: Normocephalic and atraumatic.  Mouth/Throat: Oropharynx is clear and moist.  Neck: Normal range of motion. Neck supple.  Cardiovascular: Normal rate and regular rhythm.  Exam reveals no gallop and no friction rub.   No murmur heard. Pulmonary/Chest: Effort normal and breath sounds normal. No respiratory distress. She has no wheezes. She has no rales.  Abdominal: Soft. Bowel sounds are normal. She exhibits no distension. There is no tenderness.  Musculoskeletal: Normal range of motion.  Neurological: She is alert and oriented to person, place, and time.  Skin: Skin is warm and dry. She is not diaphoretic.  Nursing note and vitals reviewed.    ED Treatments / Results  Labs (all labs ordered are listed, but only abnormal results are displayed) Labs Reviewed  URINALYSIS, ROUTINE W REFLEX MICROSCOPIC - Abnormal; Notable for the following:       Result Value   APPearance CLOUDY (*)    Nitrite POSITIVE (*)    Leukocytes, UA SMALL (*)    All other components within normal limits  URINALYSIS, MICROSCOPIC (REFLEX) - Abnormal; Notable for the following:    Bacteria, UA MANY (*)    Squamous Epithelial / LPF 0-5 (*)    All other components within normal limits  BASIC METABOLIC PANEL  CBC WITH DIFFERENTIAL/PLATELET  TROPONIN I    EKG  EKG  Interpretation  Date/Time:  Sunday March 01 2017 15:45:04 EDT Ventricular Rate:  93 PR Interval:    QRS Duration: 104 QT Interval:  352 QTC Calculation: 438 R Axis:   -34 Text Interpretation:  Sinus rhythm Consider left atrial enlargement Abnormal R-wave progression, late transition Left ventricular hypertrophy Confirmed by Veryl Speak [phone]) on 03/01/2017 3:58:34 PM       Radiology No results found.  Procedures Procedures (including critical care time)  Medications Ordered in ED Medications - No data to display   Initial Impression / Assessment and Plan / ED Course  I have reviewed the triage vital signs and the nursing notes.  Pertinent labs & imaging results that were available during my care of the patient were reviewed by me and considered in my medical decision making (see chart for details).  Patient presents with complaints of weakness, nausea, and generalized malaise. She is also complaining of urinary frequency. She does appear to have a urinary tract infection. Remainder the workup is unremarkable. Her electrolytes are all normal and CBC is unremarkable. EKG and troponin are negative. The only abnormality otherwise is a mildly elevated glucose of 123.  He will be discharged with Keflex and is to follow-up as needed if not improving.  Final Clinical Impressions(s) / ED Diagnoses   Final diagnoses:  None    New Prescriptions New Prescriptions   No medications on file     Veryl Speak, MD 03/01/17 1640

## 2017-04-06 ENCOUNTER — Telehealth: Payer: Self-pay | Admitting: Internal Medicine

## 2017-04-06 NOTE — Telephone Encounter (Signed)
Spoke with pt. She is having issues with cough and congestion. Pt has not seen MW since 2016. Advised the pt that [facility] does not have any openings until September. Pt states that she will try to reach out to her PCP. Nothing further was needed.

## 2017-04-29 ENCOUNTER — Ambulatory Visit (INDEPENDENT_AMBULATORY_CARE_PROVIDER_SITE_OTHER)
Admission: RE | Admit: 2017-04-29 | Discharge: 2017-04-29 | Disposition: A | Discharge status: Home / Self Care | Payer: Medicaid Other | Source: Ambulatory Visit | Attending: Internal Medicine | Admitting: Internal Medicine

## 2017-04-29 ENCOUNTER — Other Ambulatory Visit (INDEPENDENT_AMBULATORY_CARE_PROVIDER_SITE_OTHER): Payer: Medicaid Other

## 2017-04-29 ENCOUNTER — Encounter: Payer: Self-pay | Admitting: Internal Medicine

## 2017-04-29 ENCOUNTER — Ambulatory Visit (INDEPENDENT_AMBULATORY_CARE_PROVIDER_SITE_OTHER): Payer: Medicaid Other | Admitting: Internal Medicine

## 2017-04-29 VITALS — BP 160/90 | HR 109 | Ht 69.0 in | Wt 336.0 lb

## 2017-04-29 DIAGNOSIS — R0609 Other forms of dyspnea: Secondary | ICD-10-CM

## 2017-04-29 DIAGNOSIS — J45991 Cough variant asthma: Secondary | ICD-10-CM | POA: Diagnosis not present

## 2017-04-29 LAB — CBC WITH DIFFERENTIAL/PLATELET
BASOS ABS: 0 10*3/uL (ref 0.0–0.1)
Basophils Relative: 0.9 % (ref 0.0–3.0)
Eosinophils Absolute: 0 10*3/uL (ref 0.0–0.7)
Eosinophils Relative: 0.7 % (ref 0.0–5.0)
HEMATOCRIT: 40.1 % (ref 36.0–46.0)
HEMOGLOBIN: 13.3 g/dL (ref 12.0–15.0)
LYMPHS PCT: 30.1 % (ref 12.0–46.0)
Lymphs Abs: 1.4 10*3/uL (ref 0.7–4.0)
MCHC: 33.1 g/dL (ref 30.0–36.0)
MCV: 90.7 fl (ref 78.0–100.0)
Monocytes Absolute: 0.4 10*3/uL (ref 0.1–1.0)
Monocytes Relative: 8.2 % (ref 3.0–12.0)
Neutro Abs: 2.8 10*3/uL (ref 1.4–7.7)
Neutrophils Relative %: 60.1 % (ref 43.0–77.0)
Platelets: 289 10*3/uL (ref 150.0–400.0)
RBC: 4.43 Mil/uL (ref 3.87–5.11)
RDW: 13.6 % (ref 11.5–15.5)
WBC: 4.6 10*3/uL (ref 4.0–10.5)

## 2017-04-29 LAB — BASIC METABOLIC PANEL
BUN: 12 mg/dL (ref 6–23)
CO2: 23 mEq/L (ref 19–32)
Calcium: 10.1 mg/dL (ref 8.4–10.5)
Chloride: 107 mEq/L (ref 96–112)
Creatinine, Ser: 0.8 mg/dL (ref 0.40–1.20)
GFR: 94.46 mL/min (ref >60.00)
GLUCOSE: 138 mg/dL — AB (ref 70–99)
POTASSIUM: 4 meq/L (ref 3.5–5.1)
Sodium: 143 mEq/L (ref 135–145)

## 2017-04-29 LAB — BRAIN NATRIURETIC PEPTIDE: PRO B NATRI PEPTIDE: 66 pg/mL (ref 0.0–100.0)

## 2017-04-29 LAB — SEDIMENTATION RATE: Sed Rate: 69 mm/hr — ABNORMAL HIGH (ref 0–30)

## 2017-04-29 LAB — TSH: TSH: 1.91 u[IU]/mL (ref 0.35–4.50)

## 2017-04-29 MED ORDER — BUDESONIDE-FORMOTEROL FUMARATE 80-4.5 MCG/ACT IN AERO: 2.0000 | INHALATION_SPRAY | Freq: Two times a day (BID) | RESPIRATORY_TRACT | 11 refills | Status: DC

## 2017-04-29 MED ORDER — BUDESONIDE-FORMOTEROL FUMARATE 80-4.5 MCG/ACT IN AERO: 2.0000 | INHALATION_SPRAY | Freq: Two times a day (BID) | RESPIRATORY_TRACT | 0 refills | Status: DC

## 2017-04-29 NOTE — Assessment & Plan Note (Addendum)
Symptoms are markedly disproportionate to objective findings and not clear this is actually much of a  lung problem but pt does appear to have difficult to sort out respiratory symptoms of unknown origin for which  DDX  = almost all start with A and  include Adherence, Ace Inhibitors, Acid Reflux, Active Sinus Disease, Alpha 1 Antitripsin deficiency, Anxiety masquerading as Airways dz,  ABPA,  Allergy(esp in young), Aspiration (esp in elderly), Adverse effects of meds,  Active smokers, A bunch of PE's (a small clot burden can't cause this syndrome unless there is already severe underlying pulm or vascular dz with poor reserve) plus two Bs  = Bronchiectasis and Beta blocker use..and one C= CHF     Adherence is always the initial "prime suspect" and is a multilayered concern that requires a "trust but verify" approach in every patient - starting with knowing how to use medications, especially inhalers, correctly, keeping up with refills and understanding the fundamental difference between maintenance and prns vs those medications only taken for a very short course and then stopped and not refilled.  - see hfa teaching - return with all meds in hand using a trust but verify approach to confirm accurate Medication  Reconciliation The principal here is that until we are certain that the  patients are doing what we've asked, it makes no sense to ask them to do more.    ? Acid (or non-acid) GERD > always difficult to exclude as up to 75% of pts in some series report no assoc GI/ Heartburn symptoms> rec max (24h)  acid suppression and diet restrictions/ reviewed and instructions given in writing.   ? Allergy/asthma > check profile/ use just low dose ics for now  ? Aspiration > noted dental procedure prior to flare but no def symptoms or signs of infection   ? Active sinus dz > consider sinus ct next   ? A bunch of pe' s > check d dimer but note pattern of L cp that goes away for days then recurs in same  exact location is not c/w a PE source   ? CHF with asym leg edema >  Excluded with BNP so low    I had an extended discussion with the patient reviewing all relevant studies completed to date and  lasting 25 minutes of a 40  minute acute office visit  To re-establish   re  severe non-specific but potentially very serious refractory respiratory symptoms of uncertain and potentially multiple  etiologies.   Each maintenance medication was reviewed in detail including most importantly the difference between maintenance and prns and under what circumstances the prns are to be triggered using an action plan format that is not reflected in the computer generated alphabetically organized AVS.    Please see AVS for specific instructions unique to this office visit that I personally wrote and verbalized to the the pt in detail and then reviewed with pt  by my nurse highlighting any changes in therapy/plan of care  recommended at today's visit.

## 2017-04-29 NOTE — Progress Notes (Signed)
Subjective:     Patient ID: Yesenia Howard, female   DOB: 06-25-1956      MRN: [id number]     Brief patient profile:  70  yobf with morbid obesity/  never smoker or prior h/o asthma seen in pulmonary clinic 10/17/14 sp admit:  Admit date: 07/28/2014 Discharge date: 07/31/2014 Discharge Diagnoses:  SOB (shortness of breath) Reactive airway disease, post infectious Diabetes mellitus type 2 Elevated blood-pressure reading without diagnosis of hypertension      10/17/2014 [facility] office visit/ Dominque Marlin  / not using any inhalers at all Chief Complaint  Patient presents with  . Advice Only    hosp f/u [facility]; SOB w/bronchitis and cough while sick; no symptoms at this time.   no inhalers since dec 2015  Main concern is what to do next time she gets sick, did not have prev h/o asthma/ recurrent rhinitis or bronchitis and has no symptoms now rec If get any wheezing or shortness of breath start back on dulera 100 right away = Take 2 puffs first thing in am and then another 2 puffs about 12 hours later.  Work on Engineer, technical sales technique    04/29/2017  Acute  ov/Marga Gramajo re: recurrent cough  Chief Complaint  Patient presents with  . Acute Visit    Pt c/o increased cough and chest tightness for the past month.   Cough is mainly non prod. She also has been noticing some SOB.   acute onset cough assoc with nose dripping and discomfort L chest ant/post comes and goes a day or two at a time x weeks  eval in [facility] > rx zpak/? Tessalon ?> no better  Cough is worse sitting / better noct  Surgery on gums about a month prior to OV  But no fever/ chills, cough is dry  Doe= MMRC2 = can't walk a nl pace on a flat grade s sob but does fine slow and flat  Did not follow instructions to take dulera if flared but did fine on  No rx since prev ov      No obvious day to day or daytime variability or assoc excess/ purulent sputum or mucus plugs or hemoptysis    subjective  wheeze or overt sinus or hb symptoms. No unusual exp hx or h/o childhood pna/ asthma or knowledge of premature birth.  Sleeping ok flat without nocturnal  or early am exacerbation  of respiratory  c/o's or need for noct saba. Also denies any obvious fluctuation of symptoms with weather or environmental changes or other aggravating or alleviating factors except as outlined above   Current Allergies, Complete Past Medical History, Past Surgical History, Family History, and Social History were reviewed in [employer] record.  ROS  The following are not active complaints unless bolded sore throat, dysphagia, dental problems, itching, sneezing,  nasal congestion or disharge of excess mucus or purulent secretions, ear ache,   fever, chills, sweats, unintended wt loss or wt gain, classically pleuritic or exertional cp,  orthopnea pnd or leg swelling, presyncope, palpitations, abdominal pain, anorexia, nausea, vomiting, diarrhea  or change in bowel habits or bladder habits, change in stools or change in urine, dysuria, hematuria,  rash, arthralgias, visual complaints, headache, numbness, weakness or ataxia or problems with walking or coordination,  change in mood/affect or memory.               Objective:   Physical Exam    obese bf nad  04/29/2017       336  10/17/14 351 lb (159.213 kg)  10/16/14 348 lb (157.852 kg)  07/28/14 333 lb 12.8 oz (151.411 kg)    Vital signs reviewed  - Note on arrival 02 sats  99% on  RA  And BP 160/90     HEENT: nl   turbinates, and orophanx. Nl external ear canals without cough reflex - edentulous    NECK :  without JVD/Nodes/TM/ nl carotid upstrokes bilaterally   LUNGS: no acc muscle use, clear to A and P bilaterally without cough on insp or exp maneuvers   CV:  RRR  no s3 or murmur or increase in P2, trace pitting both lower ext L > R  edema   ABD:  soft and nontender with nl excursion in the supine position. No bruits or  organomegaly, bowel sounds nl  MS:  warm without deformities, calf tenderness, cyanosis or clubbing  SKIN: warm and dry without lesions    NEURO:  alert, approp, no deficits      CXR PA and Lateral:   04/29/2017 :    I personally reviewed images and agree with radiology impression as follows:   No active cardiopulmonary disease.   Labs ordered/ reviewed:      Chemistry      Component Value Date/Time   NA 143 04/29/2017 1117   K 4.0 04/29/2017 1117   CL 107 04/29/2017 1117   CO2 23 04/29/2017 1117   BUN 12 04/29/2017 1117   CREATININE 0.80 04/29/2017 1117      Component Value Date/Time   CALCIUM 10.1 04/29/2017 1117   ALKPHOS 85 05/27/2016 1855   AST 16 05/27/2016 1855   ALT 11 (L) 05/27/2016 1855   BILITOT 0.7 05/27/2016 1855        Lab Results  Component Value Date   WBC 4.6 04/29/2017   HGB 13.3 04/29/2017   HCT 40.1 04/29/2017   MCV 90.7 04/29/2017   PLT 289.0 04/29/2017       EOS                       0.0                                                                    04/29/2017       Lab Results  Component Value Date   TSH 1.91 04/29/2017     Lab Results  Component Value Date   PROBNP 66.0 04/29/2017       Lab Results  Component Value Date   ESRSEDRATE 69 (H) 04/29/2017        Labs ordered 04/29/2017   D dimer    Assessment:

## 2017-04-29 NOTE — Patient Instructions (Addendum)
Symbicort 80 Take 2 puffs first thing in am and then another 2 puffs about 12 hours later.   Work on inhaler technique:  relax and gently blow all the way out then take a nice smooth deep breath back in, triggering the inhaler at same time you start breathing in.  Hold for up to 5 seconds if you can. Blow out thru nose. Rinse and gargle with water when done      Try prilosec otc 20mg   Take 30-60 min before first meal of the day and Pepcid ac (famotidine) 20 mg one @  bedtime until cough is completely gone for at least a week without the need for cough suppression   GERD (REFLUX)  is an extremely common cause of respiratory symptoms just like yours , many times with no obvious heartburn at all.    It can be treated with medication, but also with lifestyle changes including elevation of the head of your bed (ideally with 6 inch  bed blocks),  Smoking cessation, avoidance of late meals, excessive alcohol, and avoid fatty foods, chocolate, peppermint, colas, red wine, and acidic juices such as orange juice.  NO MINT OR MENTHOL PRODUCTS SO NO COUGH DROPS   USE SUGARLESS CANDY INSTEAD (Jolley ranchers or Stover's or Life Savers) or even ice chips will also do - the key is to swallow to prevent all throat clearing. NO OIL BASED VITAMINS - use powdered substitutes.    Take delsym two tsp every 12 hours and supplement if needed with  tramadol 50 mg up to 1-2 every 4 hours to suppress the urge to cough. Swallowing water or using ice chips/non mint and menthol containing candies (such as lifesavers or sugarless jolly ranchers) are also effective.  You should rest your voice and avoid activities that you know make you cough.  Once you have eliminated the cough for 3 straight days try reducing the tramadol first,  then the delsym as tolerated.   Pease remember to go to the lab and x-ray department downstairs in the basement  for your tests - we will call you with the results when they are  available.   Please schedule a follow up office visit in 2  weeks, sooner if needed with all meds / inhalers in hand

## 2017-04-29 NOTE — Assessment & Plan Note (Addendum)
04/29/2017  After extensive coaching HFA effectiveness =    75% > try symbicort 80 2bid   Allergy profile 04/29/2017 >  Eos 0.0/  IgE  Pending   Seems unusual that she has same problem she had in 10/2014 s need for any inhalers in interim but she's convinced it's the same problem she had then so rec add symbicort 80 x 2 weeks then ov to regroup

## 2017-04-29 NOTE — Assessment & Plan Note (Addendum)
Body mass index is 49.62 kg/m.  -  trending *down/ encouraged  Lab Results  Component Value Date   TSH 1.91 04/29/2017     Contributing to gerd/dvt PE  risk/ doe/reviewed the need and the process to achieve and maintain neg calorie balance > defer f/u primary care including intermittently monitoring thyroid status

## 2017-04-30 LAB — RESPIRATORY ALLERGY PROFILE REGION II ~~LOC~~
Allergen, A. alternata, m6: <0.1 kU/L
Allergen, Cedar tree, t12: <0.1 kU/L
Allergen, Comm Silver Birch, t9: <0.1 kU/L
Allergen, D pternoyssinus,d7: 0.23 kU/L — ABNORMAL HIGH
Allergen, Mulberry, t76: <0.1 kU/L
Allergen, P. notatum, m1: <0.1 kU/L
Aspergillus fumigatus, m3: <0.1 kU/L
Box Elder IgE: <0.1 kU/L
CLADOSPORIUM HERBARUM (M2) IGE: <0.1 kU/L
CLASS: 0
CLASS: 0
CLASS: 0
CLASS: 0
CLASS: 0
CLASS: 0
CLASS: 0
CLASS: 0
CLASS: 0
CLASS: 0
COMMON RAGWEED (SHORT) (W1) IGE: <0.1 kU/L
Cat Dander: <0.1 kU/L
Class: 0
Class: 0
Class: 0
Class: 0
Class: 0
Class: 0
Class: 0
Class: 0
Class: 0
Class: 0
Class: 0
Class: 0
Class: 0
Class: 0
D. farinae: 0.17 kU/L — ABNORMAL HIGH
Dog Dander: <0.1 kU/L
Elm IgE: <0.1 kU/L
IgE (Immunoglobulin E), Serum: 793 kU/L — ABNORMAL HIGH (ref <=114)
Johnson Grass: <0.1 kU/L
Pecan/Hickory Tree IgE: <0.1 kU/L
Rough Pigweed  IgE: <0.1 kU/L

## 2017-04-30 LAB — D-DIMER, QUANTITATIVE (NOT AT ARMC): D DIMER QUANT: 0.43 ug{FEU}/mL (ref <0.50)

## 2017-04-30 LAB — INTERPRETATION:

## 2017-05-01 ENCOUNTER — Telehealth: Payer: Self-pay | Admitting: Internal Medicine

## 2017-05-01 MED ORDER — TRAMADOL HCL 50 MG PO TABS: 50.0000 mg | ORAL_TABLET | ORAL | 0 refills | Status: DC | PRN

## 2017-05-01 NOTE — Telephone Encounter (Signed)
Pt did not have any tramdol as initially reported and reflected in avs instructions  rec tramadol 50 mg  1 q 4h prn #40, no refills. Called in

## 2017-05-04 NOTE — Progress Notes (Signed)
ATC, NA and no VM  

## 2017-05-04 NOTE — Progress Notes (Signed)
ATC, NA no VM 

## 2017-05-07 ENCOUNTER — Telehealth: Payer: Self-pay | Admitting: Internal Medicine

## 2017-05-07 NOTE — Telephone Encounter (Signed)
Notes recorded by Tanda Rockers, MD on 04/29/2017 at 7:49 PM EDT Call pt: Reviewed cxr and no acute change so no change in recommendations made at ov ----------------------- Notes recorded by Tanda Rockers, MD on 05/01/2017 at 6:47 AM EDT Call patient : Study is c/w allergy to dust only > rec avoidance ------------------------ Spoke with pt. She is aware of her results. Nothing further was needed.

## 2017-05-14 ENCOUNTER — Ambulatory Visit: Payer: Medicaid Other | Admitting: Internal Medicine

## 2017-05-19 ENCOUNTER — Ambulatory Visit (INDEPENDENT_AMBULATORY_CARE_PROVIDER_SITE_OTHER): Payer: Medicaid Other | Admitting: Internal Medicine

## 2017-05-19 ENCOUNTER — Encounter: Payer: Self-pay | Admitting: Internal Medicine

## 2017-05-19 VITALS — BP 132/80 | HR 99 | Ht 69.0 in | Wt 331.0 lb

## 2017-05-19 DIAGNOSIS — K589 Irritable bowel syndrome without diarrhea: Secondary | ICD-10-CM | POA: Diagnosis not present

## 2017-05-19 DIAGNOSIS — J45991 Cough variant asthma: Secondary | ICD-10-CM

## 2017-05-19 MED ORDER — PREDNISONE 10 MG PO TABS: ORAL_TABLET | ORAL | 0 refills | Status: DC

## 2017-05-19 NOTE — Progress Notes (Signed)
Subjective:     Patient ID: Yesenia Howard, female   DOB: April 14, 1956      MRN: [id number]     Brief patient profile:  52  yobf with morbid obesity/  never smoker or prior h/o asthma seen in pulmonary clinic 10/17/14 sp admit:   Admit date: 07/28/2014 Discharge date: 07/31/2014 Discharge Diagnoses:  SOB (shortness of breath) Reactive airway disease, post infectious Diabetes mellitus type 2 Elevated blood-pressure reading without diagnosis of hypertension      10/17/2014 1st [facility] office visit/ Yesenia Howard  / not using any inhalers at all Chief Complaint  Patient presents with  . Advice Only    hosp f/u [facility]; SOB w/bronchitis and cough while sick; no symptoms at this time.  no inhalers since dec 2015  Main concern is what to do next time she gets sick, did not have prev h/o asthma/ recurrent rhinitis or bronchitis and has no symptoms now rec If get any wheezing or shortness of breath start back on dulera 100 right away = Take 2 puffs first thing in am and then another 2 puffs about 12 hours later.  Work on Engineer, technical sales technique    04/29/2017  Acute  ov/Yesenia Howard re: recurrent cough  Chief Complaint  Patient presents with  . Acute Visit    Pt c/o increased cough and chest tightness for the past month.   Cough is mainly non prod. She also has been noticing some SOB.   acute onset cough assoc with nose dripping and discomfort L chest ant/post comes and goes a day or two at a time x weeks  eval in [facility] > rx zpak/? Tessalon ?> no better  Cough is worse sitting / better noct  Surgery on gums about a month prior to OV  But no fever/ chills, cough is dry  Doe= MMRC2 = can't walk a nl pace on a flat grade s sob but does fine slow and flat  Did not follow instructions to take dulera if flared but did fine on  No rx since prev ov  rec Symbicort 80 Take 2 puffs first thing in am and then another 2 puffs about 12 hours later.  Work on inhaler technique: Try  prilosec otc 20mg   Take 30-60 min before first meal of the day and Pepcid ac (famotidine) 20 mg one @  bedtime until cough is completely gone for at least a week without the need for cough suppression GERD  Take delsym two tsp every 12 hours and supplement if needed with  tramadol 50 mg up to 1-2 every 4 hours to suppress the urge to cough. Swallowing water or using ice chips/non mint and menthol containing candies (such as lifesavers or sugarless jolly ranchers) are also effective.  You should rest your voice and avoid activities that you know make you cough. Once you have eliminated the cough for 3 straight days try reducing the tramadol first,  then the delsym as tolerated.   Pease remember to go to the lab and x-ray department downstairs in the basement  for your tests - we will call you with the results when they are available. Please schedule a follow up office visit in 2  weeks, sooner if needed with all meds / inhalers in hand    05/19/2017  f/u ov/Yesenia Howard re: possible cough variant asthma/ very poor hfa/ did not bring meds  Chief Complaint  Patient presents with  . Follow-up    Cough has worsened since she started  symbicort. She states she is coughing up some clear to white sputum.     breathing is better  - no longer  limited by breathing from desired activities   hfa is extremely poor  Cp is migratory, present "for a minute" always better supine as is cough and sleeps fine Never took more than 4 tramadol in a day and "didn't help" cp or cough  No obvious day to day or daytime variability or assoc  purulent sputum or mucus plugs or hemoptysis or cp or chest tightness, subjective wheeze or overt sinus or hb symptoms. No unusual exp hx or h/o childhood pna/ asthma or knowledge of premature birth.  Sleeping ok flat without nocturnal  or early am exacerbation  of respiratory  c/o's or need for noct saba. Also denies any obvious fluctuation of symptoms with weather or environmental changes or  other aggravating or alleviating factors except as outlined above   Current Allergies, Complete Past Medical History, Past Surgical History, Family History, and Social History were reviewed in [employer] record.  ROS  The following are not active complaints unless bolded Hoarseness, sore throat, dysphagia, dental problems, itching, sneezing,  nasal congestion or discharge of excess mucus or purulent secretions, ear ache,   fever, chills, sweats, unintended wt loss or wt gain, classically pleuritic or exertional cp,  orthopnea pnd or leg swelling, presyncope, palpitations, abdominal pain, anorexia, nausea, vomiting, diarrhea  or change in bowel habits or change in bladder habits, change in stools or change in urine, dysuria, hematuria,  rash, arthralgias, visual complaints, headache, numbness, weakness or ataxia or problems with walking or coordination,  change in mood/affect or memory.        Current Meds  Medication Sig  . budesonide-formoterol (SYMBICORT) 80-4.5 MCG/ACT inhaler Inhale 2 puffs into the lungs 2 (two) times daily.  Marland Kitchen dextromethorphan (DELSYM) 30 MG/5ML liquid Take by mouth 2 (two) times daily as needed for cough.  . traMADol (ULTRAM) 50 MG tablet Take 1 tablet (50 mg total) by mouth every 4 (four) hours as needed.                  Objective:   Physical Exam    obese bf nad  Very evasive historian   05/19/2017       331  04/29/2017       336  10/17/14 351 lb (159.213 kg)  10/16/14 348 lb (157.852 kg)  07/28/14 333 lb 12.8 oz (151.411 kg)    Vital signs reviewed  - Note on arrival 02 sats  98% on  RA      HEENT: nl   turbinates, and orophanx. Nl external ear canals without cough reflex - edentulous    NECK :  without JVD/Nodes/TM/ nl carotid upstrokes bilaterally   LUNGS: no acc muscle use, clear to A and P bilaterally without cough on insp or exp maneuvers   CV:  RRR  no s3 or murmur or increase in P2, min lower ext L > R  edema    ABD:  soft and nontender with nl excursion in the supine position. No bruits or organomegaly, bowel sounds nl  MS:  warm without deformities, calf tenderness, cyanosis or clubbing  SKIN: warm and dry without lesions    NEURO:  alert, approp, no deficits      CXR PA and Lateral:   04/29/2017 :    I personally reviewed images and agree with radiology impression as follows:   No active cardiopulmonary disease.  Labs   reviewed:      Chemistry      Component Value Date/Time   NA 143 04/29/2017 1117   K 4.0 04/29/2017 1117   CL 107 04/29/2017 1117   CO2 23 04/29/2017 1117   BUN 12 04/29/2017 1117   CREATININE 0.80 04/29/2017 1117      Component Value Date/Time   CALCIUM 10.1 04/29/2017 1117   ALKPHOS 85 05/27/2016 1855   AST 16 05/27/2016 1855   ALT 11 (L) 05/27/2016 1855   BILITOT 0.7 05/27/2016 1855        Lab Results  Component Value Date   WBC 4.6 04/29/2017   HGB 13.3 04/29/2017   HCT 40.1 04/29/2017   MCV 90.7 04/29/2017   PLT 289.0 04/29/2017       EOS                       0.0                                                                    04/29/2017       Lab Results  Component Value Date   TSH 1.91 04/29/2017     Lab Results  Component Value Date   PROBNP 66.0 04/29/2017       Lab Results  Component Value Date   ESRSEDRATE 69 (H) 04/29/2017          Assessment:

## 2017-05-19 NOTE — Patient Instructions (Addendum)
Prednisone 10 mg take  4 each am x 2 days,   2 each am x 2 days,  1 each am x 2 days and stop   Work on inhaler technique:  relax and gently blow all the way out then take a nice smooth deep breath back in, triggering the inhaler at same time you start breathing in.  Hold for up to 5 seconds if you can. Blow out thru nose. Rinse and gargle with water when done   For drainage / throat tickle try take CHLORPHENIRAMINE  4 mg - take one every 4 hours as needed - available over the counter- may cause drowsiness so start with just a bedtime dose or two and see how you tolerate it before trying in daytime   For gas pains:    Treatment consists of avoiding foods that cause gas (especially boiled eggs, mexcican food but especially  beans and undercooked vegetables like  spinach and some salads)  and citrucel 1 heaping tsp twice daily with a large glass of water.    If not better in 2 weeks >  Return   with all medications /inhalers/ solutions in hand so we can verify exactly what you are taking. This includes all medications from all doctors and over the counters

## 2017-05-20 DIAGNOSIS — K589 Irritable bowel syndrome without diarrhea: Secondary | ICD-10-CM | POA: Insufficient documentation

## 2017-05-20 NOTE — Assessment & Plan Note (Addendum)
04/29/2017   try symbicort 80 2bid  Allergy profile 04/29/2017 >  Eos 0.0 /  IgE 793  RAST pos dust only - 05/19/2017  After extensive coaching HFA effectiveness =    25% from a baseline of 0 (breathing out when triggers) > rec   1st gen H1 blockers per guidelines  And Prednisone 10 mg take  4 each am x 2 days,   2 each am x 2 days,  1 each am x 2 days and stop and f/u with all meds in hand if not better in 2 weeks    Lack of cough resolution on a verified empirical regimen (which is desperately lacking here) could mean an alternative diagnosis, persistence of the disease state (eg sinusitis or bronchiectasis) , or inadequacy of currently available therapy (eg no medical rx available for non-acid gerd)   Since she states pred always works but is not capable of using hfa at this point, rec Prednisone 10 mg take  4 each am x 2 days,   2 each am x 2 days,  1 each am x 2 days and stop and return in 2 weeks with all meds in hand using a trust but verify approach to confirm accurate Medication  Reconciliation The principal here is that until we are certain that the  patients are doing what we've asked, it makes no sense to ask them to do more.   Advised pt:  The standardized cough guidelines published in Chest by Lissa Morales in 2006 are still the best available and consist of a multiple step process (up to 12!) , not a single office visit,  and are intended  to address this problem logically,  with an alogrithm dependent on response to empiric treatment at  each progressive step  to determine a specific diagnosis with  minimal addtional testing needed. Therefore if adherence is an issue or can't be accurately verified,  it's very unlikely the standard evaluation and treatment will be successful here.    Furthermore, response to therapy (other than acute cough suppression, which should only be used short term with avoidance of narcotic containing cough syrups if possible), can be a gradual process for which the  patient is not likely to  perceive immediate benefit.  Unlike going to an eye doctor where the best perscription is almost always the first one and is immediately effective, this is almost never the case in the management of chronic cough syndromes. Therefore the patient needs to commit up front to consistently adhere to recommendations  for up to 6 weeks of therapy directed at the likely underlying problem(s) before the response can be reasonably evaluated.

## 2017-05-20 NOTE — Assessment & Plan Note (Signed)
Body mass index is 48.88 kg/m.  -  trending down/ encouraged Lab Results  Component Value Date   TSH 1.91 04/29/2017     Contributing to gerd risk/ doe/reviewed the need and the process to achieve and maintain neg calorie balance > defer f/u primary care including intermittently monitoring thyroid status

## 2017-05-20 NOTE — Assessment & Plan Note (Signed)
Migratory cp always resolves in supine position reported 05/19/2017 > rec low gas diet/ citrucel   Classic subdiaphragmatic pain pattern suggests ibs:  Stereotypical, migratory with a very limited distribution of pain locations, daytime, not usually exacerbated by exercise  or coughing, worse in sitting position, frequently associated with generalized abd bloating, not as likely to be present supine due to the dome effect of the diaphragm which  is  canceled in that position. Frequently these patients have had multiple negative GI workups and CT scans.  Treatment consists of avoiding foods that cause gas (especially boiled eggs, mexcican food but especially  beans and undercooked vegetables like  spinach and some salads)  and citrucel 1 heaping tsp twice daily with a large glass of water.  Pain should improve w/in 2 weeks and if not then consider further GI work up.

## 2017-06-13 ENCOUNTER — Other Ambulatory Visit: Payer: Self-pay | Admitting: Internal Medicine

## 2017-06-16 ENCOUNTER — Ambulatory Visit: Payer: Medicaid Other | Admitting: Internal Medicine

## 2017-08-12 ENCOUNTER — Emergency Department (HOSPITAL_BASED_OUTPATIENT_CLINIC_OR_DEPARTMENT_OTHER)
Admission: EM | Admit: 2017-08-12 | Discharge: 2017-08-12 | Disposition: A | Discharge status: Home / Self Care | Payer: Medicaid Other | Attending: Emergency Medicine | Admitting: Emergency Medicine

## 2017-08-12 ENCOUNTER — Encounter (HOSPITAL_BASED_OUTPATIENT_CLINIC_OR_DEPARTMENT_OTHER): Payer: Self-pay | Admitting: *Deleted

## 2017-08-12 ENCOUNTER — Other Ambulatory Visit: Payer: Self-pay

## 2017-08-12 DIAGNOSIS — M545 Low back pain: Secondary | ICD-10-CM

## 2017-08-12 DIAGNOSIS — J45909 Unspecified asthma, uncomplicated: Secondary | ICD-10-CM | POA: Insufficient documentation

## 2017-08-12 DIAGNOSIS — E119 Type 2 diabetes mellitus without complications: Secondary | ICD-10-CM | POA: Insufficient documentation

## 2017-08-12 DIAGNOSIS — Z79899 Other long term (current) drug therapy: Secondary | ICD-10-CM | POA: Insufficient documentation

## 2017-08-12 DIAGNOSIS — R1032 Left lower quadrant pain: Secondary | ICD-10-CM | POA: Diagnosis present

## 2017-08-12 MED ORDER — DEXAMETHASONE 4 MG PO TABS
8.0000 mg | ORAL_TABLET | Freq: Once | ORAL | Status: AC
  Administered 2017-08-12: 8 mg via ORAL
  Filled 2017-08-12: qty 2

## 2017-08-12 MED ORDER — HYDROMORPHONE HCL 1 MG/ML IJ SOLN
0.7500 mg | Freq: Once | INTRAMUSCULAR | Status: AC
  Administered 2017-08-12: 0.75 mg via INTRAVENOUS
  Filled 2017-08-12: qty 1

## 2017-08-12 MED ORDER — MELOXICAM 7.5 MG PO TABS: 7.5000 mg | ORAL_TABLET | Freq: Every day | ORAL | 0 refills | Status: AC

## 2017-08-12 MED ORDER — DIAZEPAM 5 MG PO TABS: 2.5000 mg | ORAL_TABLET | Freq: Three times a day (TID) | ORAL | 0 refills | Status: DC | PRN

## 2017-08-12 MED ORDER — LORAZEPAM 1 MG PO TABS
0.5000 mg | ORAL_TABLET | Freq: Once | ORAL | Status: AC
  Administered 2017-08-12: 0.5 mg via ORAL
  Filled 2017-08-12: qty 1

## 2017-08-12 MED ORDER — KETOROLAC TROMETHAMINE 30 MG/ML IJ SOLN
15.0000 mg | Freq: Once | INTRAMUSCULAR | Status: AC
  Administered 2017-08-12: 15 mg via INTRAMUSCULAR
  Filled 2017-08-12: qty 1

## 2017-08-12 MED ORDER — TRAMADOL HCL 50 MG PO TABS: 50.0000 mg | ORAL_TABLET | Freq: Four times a day (QID) | ORAL | 0 refills | Status: DC | PRN

## 2017-08-12 NOTE — ED Triage Notes (Signed)
Pt reports one month of back pain off and on, worse today, to left low back radiating down her left leg, with spasms in her buttocks.

## 2017-08-17 NOTE — ED Provider Notes (Signed)
[facility] EMERGENCY DEPARTMENT Provider Note   CSN: [id number] Arrival date & time: 08/12/17  1007     History   Chief Complaint Chief Complaint  Patient presents with  . Back Pain    HPI Yesenia Howard is a 61 y.o. female.  HPI   61 year old female with back pain.  Lower back.  Onset about a month ago.  Initially intermittent for the last couple days has been fairly constant.  Radiates into her left buttock and posterior thigh.  No weakness.  No urinary complaints.  No fevers or chills.  She denies any acute trauma or strain.  Past Medical History:  Diagnosis Date  . Cataract   . Diabetes mellitus without complication ([place])    pt denies-states BS were elevated inthe past r/t prednisone-no DM meds  . Ovarian cyst   . Retinal detachment   . Sciatica     Patient Active Problem List   Diagnosis Date Noted  . 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 Cough variant asthma vs UACS 04/29/2017  . Morbid obesity due to excess calories ([place]) 04/29/2017  . Poorly controlled intermittent asthma with acute exacerbation 10/18/2014  . Diabetes mellitus without complication ([place]) 19/41/7408  . Elevated blood-pressure reading without diagnosis of hypertension 07/29/2014  . Bronchitis 07/28/2014  . Dyspnea on exertion 07/28/2014    Past Surgical History:  Procedure Laterality Date  . CATARACT EXTRACTION    . CESAREAN SECTION    . EYE SURGERY    . OVARIAN CYST REMOVAL      OB History    Gravida Para Term Preterm AB Living   3 1 1   2 1    SAB TAB Ectopic Multiple Live Births     2     1       Home Medications    Prior to Admission medications   Medication Sig Start Date End Date Taking? Authorizing Provider  diazepam (VALIUM) 5 MG tablet Take 0.5 tablets (2.5 mg total) by mouth every 8 (eight) hours as needed for muscle spasms. 08/12/17   Virgel Manifold, MD  meloxicam (MOBIC) 7.5 MG tablet Take 1 tablet (7.5 mg total) by  mouth daily. 08/12/17   Virgel Manifold, MD  traMADol (ULTRAM) 50 MG tablet TAKE 1 TABLET BY MOUTH EVERY 4 HOURS AS NEEDED FOR PAIN 06/15/17   Tanda Rockers, MD  traMADol (ULTRAM) 50 MG tablet Take 1 tablet (50 mg total) by mouth every 6 (six) hours as needed. 08/12/17   Virgel Manifold, MD    Family History Family History  Problem Relation Age of Onset  . Diabetes Mother   . Hypertension Mother   . Hypertension Sister     Social History Social History   Tobacco Use  . Smoking status: Never Smoker  . Smokeless tobacco: Never Used  Substance Use Topics  . Alcohol use: No    Alcohol/week: 0.0 oz  . Drug use: No     Allergies   Patient has no known allergies.   Review of Systems Review of Systems   Physical Exam Updated Vital Signs BP 138/82 (BP Location: Right Arm)   Pulse 79   Temp 98.2 F (36.8 C) (Oral)   Resp 18   Ht 5\' 9"  (1.753 m)   Wt (!) 142.9 kg (315 lb)   SpO2 98%   BMI 46.52 kg/m   Physical Exam  Constitutional: She appears well-developed and well-nourished. No distress.  HENT:  Head: Normocephalic and  atraumatic.  Eyes: Conjunctivae are normal. Right eye exhibits no discharge. Left eye exhibits no discharge.  Neck: Neck supple.  Cardiovascular: Normal rate, regular rhythm and normal heart sounds. Exam reveals no gallop and no friction rub.  No murmur heard. Pulmonary/Chest: Effort normal and breath sounds normal. No respiratory distress.  Abdominal: Soft. She exhibits no distension. There is no tenderness.  Musculoskeletal: She exhibits no edema or tenderness.  Back normal to inspection.  Mild tenderness to palpation in the right lower back extending into the buttock.  Positive straight leg test.  Strength is 5 out of 5 bilateral lower extremities.  Sensation is intact to light touch.  Neurological: She is alert.  Skin: Skin is warm and dry.  Psychiatric: She has a normal mood and affect. Her behavior is normal. Thought content normal.  Nursing  note and vitals reviewed.    ED Treatments / Results  Labs (all labs ordered are listed, but only abnormal results are displayed) Labs Reviewed - No data to display  EKG  EKG Interpretation None       Radiology No results found.  Procedures Procedures (including critical care time)  Medications Ordered in ED Medications  HYDROmorphone (DILAUDID) injection 0.75 mg (0.75 mg Intravenous Given 08/12/17 1111)  ketorolac (TORADOL) 30 MG/ML injection 15 mg (15 mg Intramuscular Given 08/12/17 1110)  LORazepam (ATIVAN) tablet 0.5 mg (0.5 mg Oral Given 08/12/17 1112)  dexamethasone (DECADRON) tablet 8 mg (8 mg Oral Given 08/12/17 1109)     Initial Impression / Assessment and Plan / ED Course  I have reviewed the triage vital signs and the nursing notes.  Pertinent labs & imaging results that were available during my care of the patient were reviewed by me and considered in my medical decision making (see chart for details).     Lower back pain which is radicular.  No overt red flags.  Plan symptom medic treatment.  Final Clinical Impressions(s) / ED Diagnoses   Final diagnoses:  Midline low back pain, unspecified chronicity, with sciatica presence unspecified    ED Discharge Orders        Ordered    traMADol (ULTRAM) 50 MG tablet  Every 6 hours PRN     08/12/17 1218    meloxicam (MOBIC) 7.5 MG tablet  Daily     08/12/17 1218    diazepam (VALIUM) 5 MG tablet  Every 8 hours PRN     08/12/17 1218       Virgel Manifold, MD 08/17/17 1342

## 2017-09-08 ENCOUNTER — Telehealth: Payer: Self-pay | Admitting: *Deleted

## 2017-09-08 NOTE — Telephone Encounter (Signed)
Left vmail for patient to call and schedule Annual/pap.Marland Kitchen

## 2017-09-10 ENCOUNTER — Other Ambulatory Visit: Payer: Self-pay | Admitting: Internal Medicine

## 2017-10-29 ENCOUNTER — Encounter: Payer: Self-pay | Admitting: *Deleted

## 2018-01-15 ENCOUNTER — Other Ambulatory Visit: Payer: Self-pay | Admitting: Neurological Surgery

## 2018-01-15 DIAGNOSIS — M5412 Radiculopathy, cervical region: Secondary | ICD-10-CM

## 2018-01-15 DIAGNOSIS — M48062 Spinal stenosis, lumbar region with neurogenic claudication: Secondary | ICD-10-CM

## 2018-02-08 ENCOUNTER — Inpatient Hospital Stay
Admission: RE | Admit: 2018-02-08 | Discharge: 2018-02-08 | Disposition: A | Discharge status: Home / Self Care | Payer: Medicaid Other | Source: Ambulatory Visit | Attending: Neurological Surgery | Admitting: Neurological Surgery

## 2018-02-08 ENCOUNTER — Other Ambulatory Visit: Payer: Medicaid Other

## 2018-02-08 ENCOUNTER — Other Ambulatory Visit: Payer: Self-pay | Admitting: Neurological Surgery

## 2018-02-08 DIAGNOSIS — Z1231 Encounter for screening mammogram for malignant neoplasm of breast: Secondary | ICD-10-CM

## 2018-02-14 ENCOUNTER — Ambulatory Visit
Admission: RE | Admit: 2018-02-14 | Discharge: 2018-02-14 | Disposition: A | Discharge status: Home / Self Care | Payer: Medicaid Other | Source: Ambulatory Visit | Attending: Neurological Surgery | Admitting: Neurological Surgery

## 2018-02-14 DIAGNOSIS — M48062 Spinal stenosis, lumbar region with neurogenic claudication: Secondary | ICD-10-CM

## 2018-02-14 DIAGNOSIS — M5412 Radiculopathy, cervical region: Secondary | ICD-10-CM

## 2018-03-11 ENCOUNTER — Ambulatory Visit: Payer: Medicaid Other

## 2018-04-18 ENCOUNTER — Other Ambulatory Visit: Payer: Self-pay | Admitting: Internal Medicine

## 2018-05-10 ENCOUNTER — Other Ambulatory Visit: Payer: Self-pay | Admitting: Internal Medicine

## 2018-12-30 IMAGING — MR MR LUMBAR SPINE W/O CM
4 of 6 series · 17 of 48 positions shown · non-contrast
Comparison: 05/14/2014 and CT scan from 11/26/2016

CLINICAL DATA: Chronic and diffuse back pain with numbness and
weakness

EXAM:
MRI LUMBAR SPINE WITHOUT CONTRAST
TECHNIQUE: Multiplanar, multisequence MR imaging of the lumbar spine was
performed. No intravenous contrast was administered.

[Series 6: T2 · sagittal · 4.0mm · 0.73mm/px · 5 of 16 slices shown (1 of 2)]
[im 1/16]
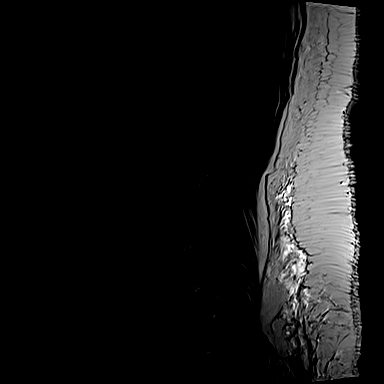
[im 4/16]
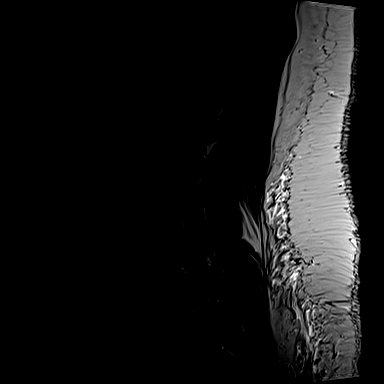
[im 8/16]
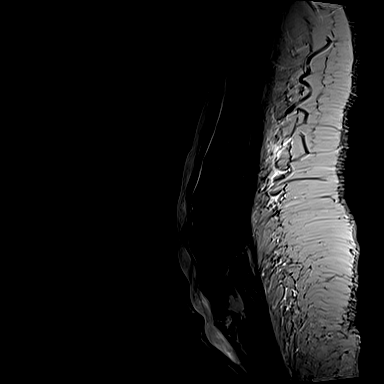
[im 12/16]
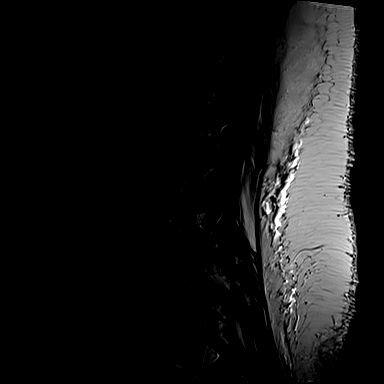
[im 16/16]
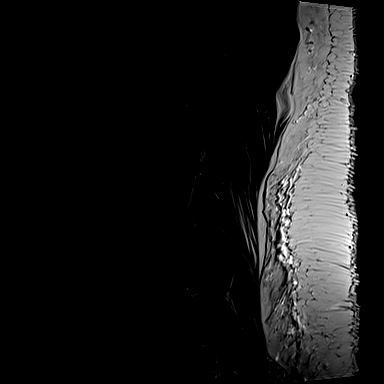

[Series 7: T1 · sagittal · 4.0mm · 0.73mm/px · 3 of 16 slices shown (1 of 2)]
[im 1/16]
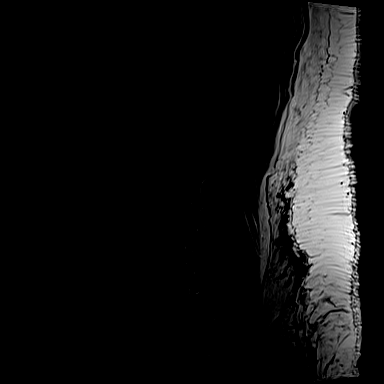
[im 8/16]
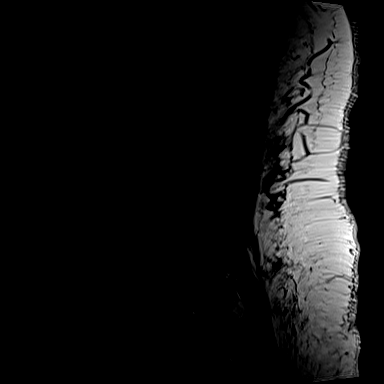
[im 16/16]
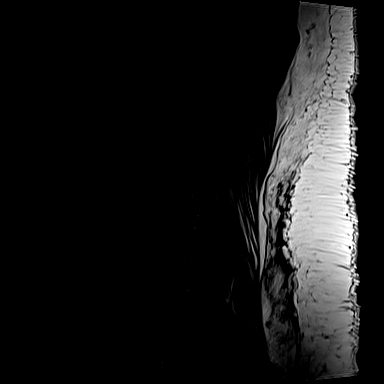

[Series 10: T1 · axial · 4.0mm · 0.31mm/px · z∈[-124,+42]mm · 3 of 43 slices shown (2 of 2)]
[im 7/43]
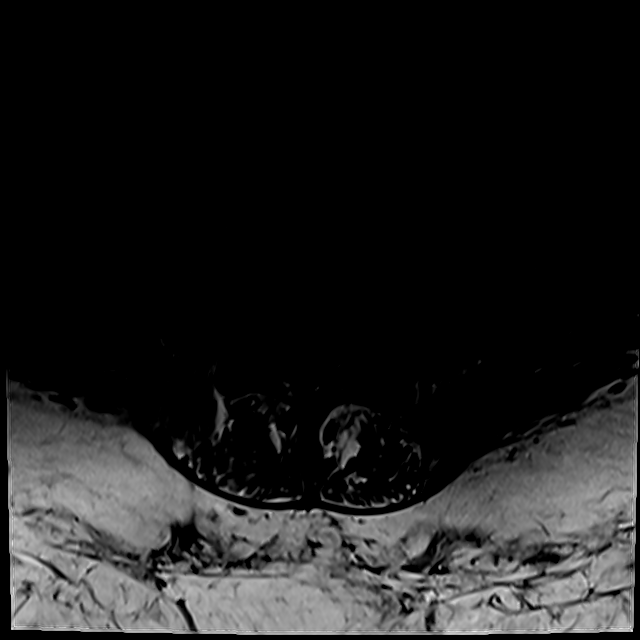
[im 23/43]
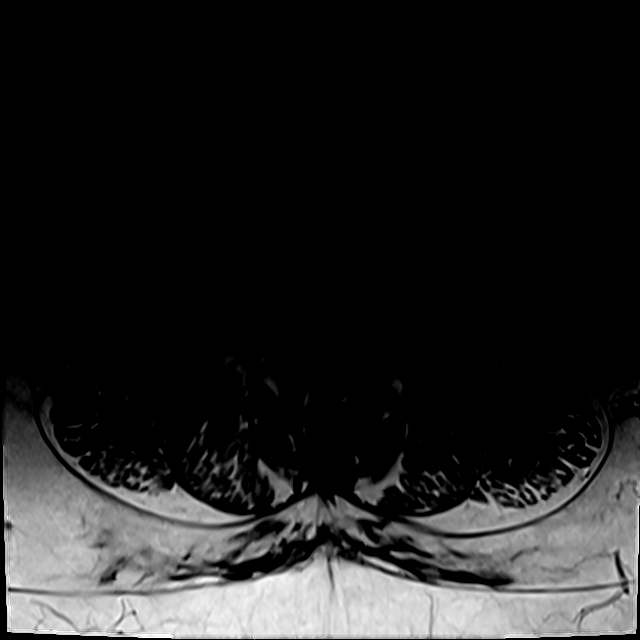
[im 36/43]
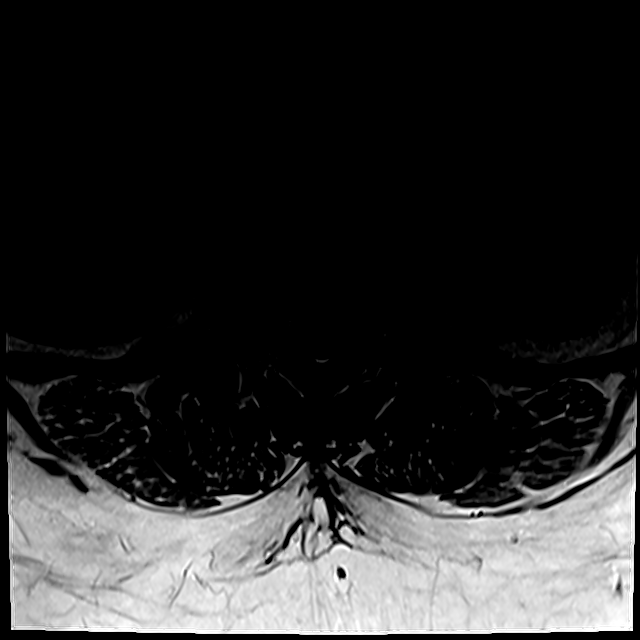

[Series 13: T2 · axial · 4.0mm · 0.31mm/px · z∈[-154,+42]mm · 6 of 43 slices shown (2 of 2)]
[im 1/43]
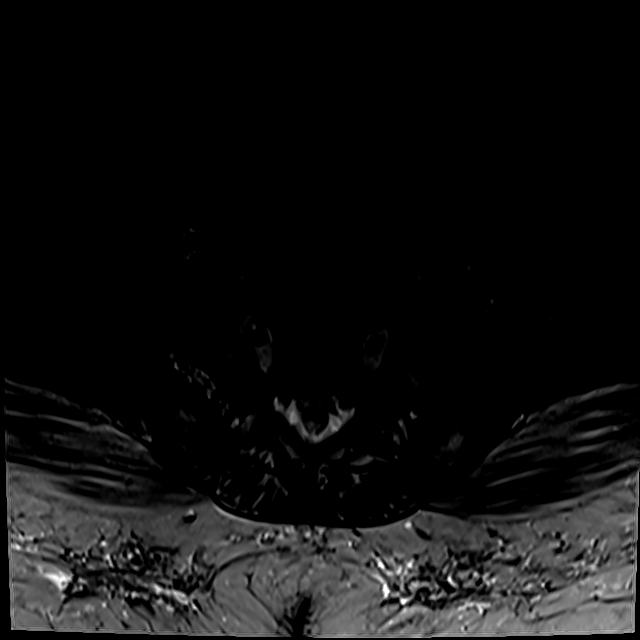
[im 7/43]
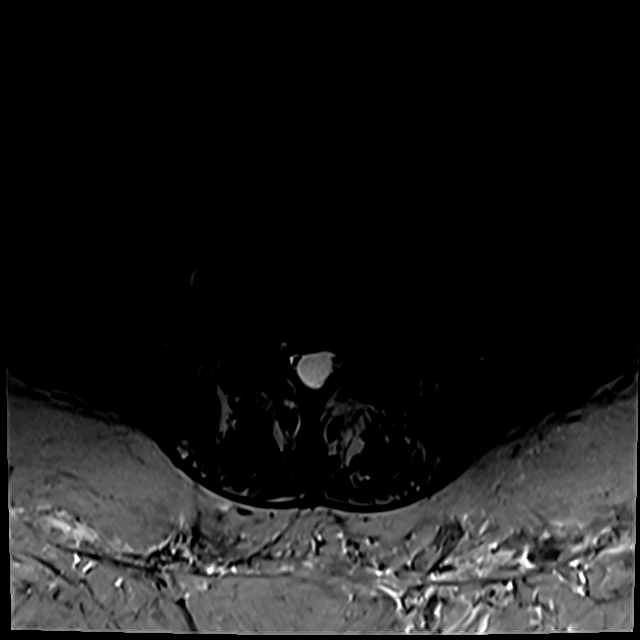
[im 13/43]
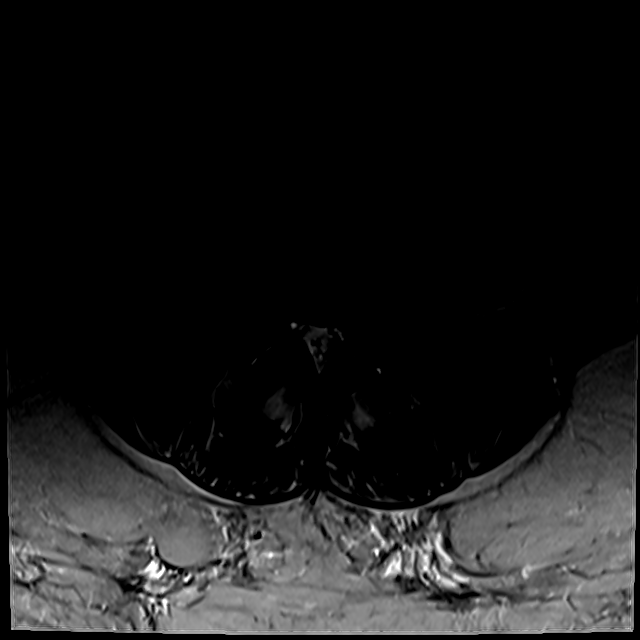
[im 20/43]
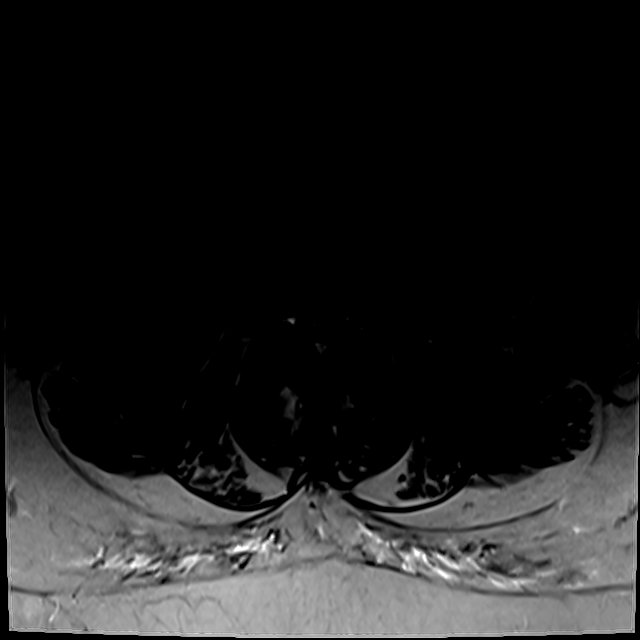
[im 23/43]
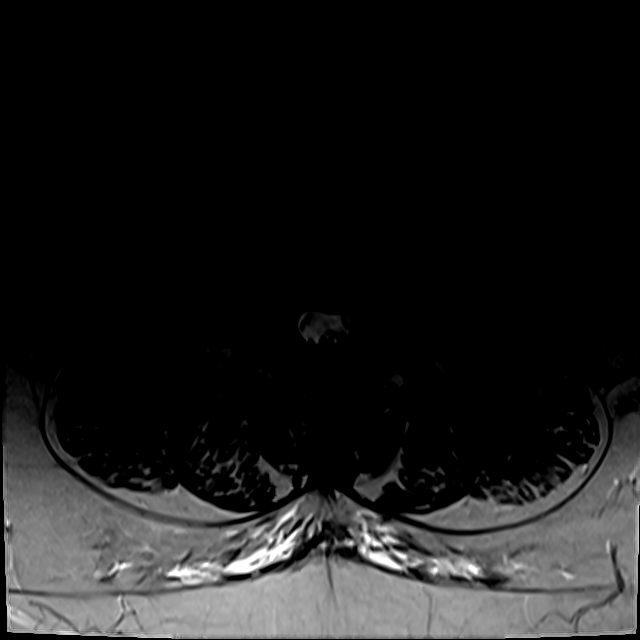
[im 36/43]
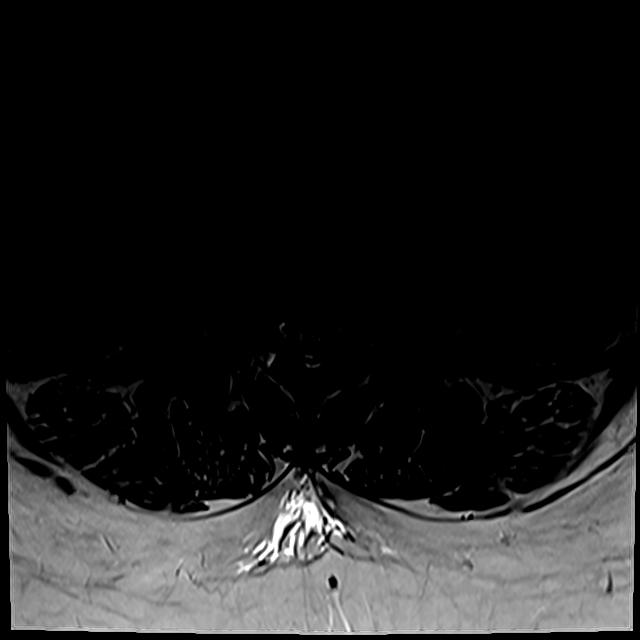

[17 of 48 positions shown; findings below may reference images not displayed]

FINDINGS: Despite efforts by the technologist and patient, motion artifact is
present on today's exam and could not be eliminated. This reduces
exam sensitivity and specificity.

Segmentation: The lowest lumbar type non-rib-bearing vertebra is
labeled as L5.

Alignment: 3 mm degenerative anterolisthesis at L4-5. No pars
defects.

Vertebrae: Mild chronic endplate scalloping in the lower lumbar
spine. Some of this may be from broad Schmorl's nodes.

Type 1 degenerative endplate findings at L5-S1. Disc desiccation at
L4-5 and L5-S1. Mild perifacet edema bilaterally at L4-5.

Marrow heterogeneity is present. Although this can be caused by
marrow infiltrative processes, the most common causes include
anemia, smoking, obesity, or advancing age.

Conus medullaris and cauda equina: Conus extends to the L1-2 level.
Conus and cauda equina appear normal.

Paraspinal and other soft tissues: Unremarkable

Disc levels:

L1-2: Mild displacement of the left L1 nerve in the lateral
extraforaminal space due to disc bulge. Small central disc
protrusion.

L2-3: Borderline left foraminal stenosis due to mild disc bulge.
Bilateral facet arthropathy.

L3-4: Mild displacement of both L3 nerves in the lateral
extraforaminal space and mild left subarticular lateral recess
stenosis with borderline central narrowing of the thecal sac due to
disc bulge, facet arthropathy, and left lateral extraforaminal disc
protrusion. Roughly similar to the 7000 exam.

L4-5: Mild bilateral foraminal stenosis with mild bilateral
subarticular lateral recess stenosis, mild central narrowing of the
thecal sac, mild displacement of both L4 nerves in the lateral
extraforaminal space due to disc bulge and facet arthropathy.
Roughly similar to the prior exam.

L5-S1: Moderate to prominent bilateral foraminal stenosis with
moderate right and mild left subarticular lateral recess stenosis
due to disc bulge, right paracentral disc protrusion, and facet
arthropathy. Impingement is worsened this level compared to the
prior exam.
IMPRESSION: 1. Lumbar spondylosis and degenerative disc disease, causing
moderate to prominent impingement at L5-S1; and mild impingement at
L1-2, L3-4, and L4-5, as detailed above. The impingement at L5-S1 is
worsened compared to 05/14/2014 primarily due to increasing size of
a right paracentral disc protrusion.
2. Despite efforts by the technologist and patient, motion artifact
is present on today's exam and could not be eliminated. This reduces
exam sensitivity and specificity.
3. Marrow heterogeneity is present. Although this can be caused by
marrow infiltrative processes, the most common causes include
anemia, smoking, obesity, or advancing age.

## 2019-03-22 ENCOUNTER — Ambulatory Visit: Payer: Medicaid Other | Admitting: Advanced Practice Midwife

## 2019-06-19 ENCOUNTER — Other Ambulatory Visit: Payer: Self-pay

## 2019-06-19 ENCOUNTER — Emergency Department (HOSPITAL_BASED_OUTPATIENT_CLINIC_OR_DEPARTMENT_OTHER)
Admission: EM | Admit: 2019-06-19 | Discharge: 2019-06-19 | Disposition: A | Discharge status: Home / Self Care | Payer: Medicaid Other | Attending: Emergency Medicine | Admitting: Emergency Medicine

## 2019-06-19 ENCOUNTER — Encounter (HOSPITAL_BASED_OUTPATIENT_CLINIC_OR_DEPARTMENT_OTHER): Payer: Self-pay | Admitting: Emergency Medicine

## 2019-06-19 DIAGNOSIS — E119 Type 2 diabetes mellitus without complications: Secondary | ICD-10-CM | POA: Insufficient documentation

## 2019-06-19 DIAGNOSIS — M545 Low back pain: Secondary | ICD-10-CM | POA: Diagnosis present

## 2019-06-19 DIAGNOSIS — M5432 Sciatica, left side: Secondary | ICD-10-CM | POA: Diagnosis not present

## 2019-06-19 DIAGNOSIS — Z79899 Other long term (current) drug therapy: Secondary | ICD-10-CM | POA: Diagnosis not present

## 2019-06-19 MED ORDER — METHOCARBAMOL 500 MG PO TABS
500.0000 mg | ORAL_TABLET | Freq: Once | ORAL | Status: AC
  Administered 2019-06-19: 500 mg via ORAL
  Filled 2019-06-19: qty 1

## 2019-06-19 MED ORDER — METHOCARBAMOL 500 MG PO TABS: 500.0000 mg | ORAL_TABLET | Freq: Two times a day (BID) | ORAL | 0 refills | Status: DC

## 2019-06-19 MED ORDER — PREDNISONE 10 MG PO TABS: 20.0000 mg | ORAL_TABLET | Freq: Two times a day (BID) | ORAL | 0 refills | Status: AC

## 2019-06-19 MED ORDER — KETOROLAC TROMETHAMINE 60 MG/2ML IM SOLN
30.0000 mg | Freq: Once | INTRAMUSCULAR | Status: AC
  Administered 2019-06-19: 30 mg via INTRAMUSCULAR
  Filled 2019-06-19: qty 2

## 2019-06-19 MED ORDER — LIDOCAINE 5 % EX PTCH: 1.0000 | MEDICATED_PATCH | CUTANEOUS | 0 refills | Status: AC

## 2019-06-19 MED ORDER — PREDNISONE 10 MG PO TABS
60.0000 mg | ORAL_TABLET | Freq: Once | ORAL | Status: AC
  Administered 2019-06-19: 60 mg via ORAL
  Filled 2019-06-19: qty 1

## 2019-06-19 NOTE — Discharge Instructions (Addendum)
Expect your soreness to increase over the next 2-3 days. Take it easy, but do not lay around too much as this may make any stiffness worse.  Antiinflammatory medications: Take 600 mg of ibuprofen every 6 hours or 440 mg (over the counter dose) to 500 mg (prescription dose) of naproxen every 12 hours for the next 3 days. After this time, these medications may be used as needed for pain. Take these medications with food to avoid upset stomach. Choose only one of these medications, do not take them together. Acetaminophen (generic for Tylenol): Should you continue to have additional pain while taking the ibuprofen or naproxen, you may add in acetaminophen as needed. Your daily total maximum amount of acetaminophen from all sources should be limited to 4000mg /day for persons without liver problems, or 2000mg /day for those with liver problems. Methocarbamol: Methocarbamol (generic for Robaxin) is a muscle relaxer and can help relieve stiff muscles or muscle spasms.  Do not drive or perform other dangerous activities while taking this medication as it can cause drowsiness as well as changes in reaction time and judgement. Prednisone: Take the prednisone, as prescribed, until finished. If you are a diabetic, please know prednisone can raise your blood sugar temporarily. Lidocaine patches: These are available via either prescription or over-the-counter. The over-the-counter option may be more economical one and are likely just as effective. There are multiple over-the-counter brands, such as Salonpas. Exercises: Be sure to perform the attached exercises starting with three times a week and working up to performing them daily. This is an essential part of preventing long term problems.  Follow up: Follow up with a primary care provider and the neurosurgical specialist for any future management of these complaints. Be sure to follow up within 7-10 days, if possible. Return: Return to the ED should symptoms worsen,  including inability to use one or both lower extremities, complete numbness in the extremity, inability to urinate or unintentional loss of urine or bowels, or any other major concerns.  For prescription assistance, may try using prescription discount sites or apps, such as goodrx.com

## 2019-06-19 NOTE — ED Triage Notes (Signed)
Pt c/o lower back pain with bilateral radiation of pain into legs for months. Pt reports hx of same. Pt denies injury. Pt states pain continues to worsen

## 2019-06-19 NOTE — ED Provider Notes (Signed)
[place] [facility] EMERGENCY DEPARTMENT Provider Note   CSN: [id number] Arrival date & time: 06/19/19  1502     History   Chief Complaint Chief Complaint  Patient presents with  . Back Pain  . Leg Pain    HPI Yesenia Howard is a 63 y.o. female.     HPI   Yesenia Howard is a 63 y.o. female, with a history of DM and sciatica, presenting to the ED with back pain persistent over the last several months. Pain is mostly in the left side lower back and buttocks, aching, moderate to severe, radiating into the back of the left leg. She has previously been evaluated by neurosurgeon, Dr. Ronnald Ramp, with most recent visit last year. She suspects her pain may be due to a sciatica flare. Denies fever/chills, abdominal pain, falls/trauma, chest pain, shortness of breath, urinary symptoms, numbness, weakness, changes in bowel or bladder function, saddle anesthesias, or any other complaints.  She is on medications for newly diagnosed DM (not metformin) and HTN, but does not remember the names of the medications.   Past Medical History:  Diagnosis Date  . Cataract   . Diabetes mellitus without complication ([place])    pt denies-states BS were elevated inthe past r/t prednisone-no DM meds  . Ovarian cyst   . Retinal detachment   . Sciatica     Patient Active Problem List   Diagnosis Date Noted  . 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 Cough variant asthma vs UACS 04/29/2017  . Morbid obesity due to excess calories ([place]) 04/29/2017  . Poorly controlled intermittent asthma with acute exacerbation 10/18/2014  . Diabetes mellitus without complication ([place]) 0000000  . Elevated blood-pressure reading without diagnosis of hypertension 07/29/2014  . Bronchitis 07/28/2014  . Dyspnea on exertion 07/28/2014    Past Surgical History:  Procedure Laterality Date  . CATARACT EXTRACTION    . CESAREAN SECTION    . EYE SURGERY    . OVARIAN CYST REMOVAL        OB History    Gravida  3   Para  1   Term  1   Preterm      AB  2   Living  1     SAB      TAB  2   Ectopic      Multiple      Live Births  1            Home Medications    Prior to Admission medications   Medication Sig Start Date End Date Taking? Authorizing Provider  diazepam (VALIUM) 5 MG tablet Take 0.5 tablets (2.5 mg total) by mouth every 8 (eight) hours as needed for muscle spasms. 08/12/17   Virgel Manifold, MD  lidocaine (LIDODERM) 5 % Place 1 patch onto the skin daily. Remove & Discard patch within 12 hours or as directed by MD 06/19/19   Joy, Shawn C, PA-C  meloxicam (MOBIC) 7.5 MG tablet Take 1 tablet (7.5 mg total) by mouth daily. 08/12/17   Virgel Manifold, MD  methocarbamol (ROBAXIN) 500 MG tablet Take 1 tablet (500 mg total) by mouth 2 (two) times daily. 06/19/19   Joy, Shawn C, PA-C  predniSONE (DELTASONE) 10 MG tablet Take 2 tablets (20 mg total) by mouth 2 (two) times daily with a meal for 4 days. 06/19/19 06/23/19  Joy, Shawn C, PA-C  SYMBICORT 80-4.5 MCG/ACT inhaler INHALE 2 PUFFS BY MOUTH INTO THE LUNGS TWICE DAILY 05/11/18  Tanda Rockers, MD  traMADol (ULTRAM) 50 MG tablet TAKE 1 TABLET BY MOUTH EVERY 4 HOURS AS NEEDED FOR PAIN 06/15/17   Tanda Rockers, MD  traMADol (ULTRAM) 50 MG tablet Take 1 tablet (50 mg total) by mouth every 6 (six) hours as needed. 08/12/17   Virgel Manifold, MD    Family History Family History  Problem Relation Age of Onset  . Diabetes Mother   . Hypertension Mother   . Hypertension Sister     Social History Social History   Tobacco Use  . Smoking status: Never Smoker  . Smokeless tobacco: Never Used  Substance Use Topics  . Alcohol use: No    Alcohol/week: 0.0 standard drinks  . Drug use: No     Allergies   Patient has no known allergies.   Review of Systems Review of Systems  Constitutional: Negative for chills, diaphoresis and fever.  Respiratory: Negative for cough and shortness of breath.    Cardiovascular: Negative for chest pain and leg swelling.  Gastrointestinal: Negative for abdominal pain, diarrhea, nausea and vomiting.  Genitourinary: Negative for dysuria, flank pain and hematuria.  Musculoskeletal: Positive for back pain.  Neurological: Negative for syncope, weakness and numbness.  All other systems reviewed and are negative.    Physical Exam Updated Vital Signs BP (!) 144/90 (BP Location: Right Arm)   Pulse 88   Temp 98.8 F (37.1 C) (Oral)   Resp 20   Ht 5\' 9"  (1.753 m)   Wt (!) 142.9 kg   SpO2 100%   BMI 46.52 kg/m   Physical Exam Vitals signs and nursing note reviewed.  Constitutional:      General: She is not in acute distress.    Appearance: She is well-developed. She is obese. She is not diaphoretic.  HENT:     Head: Normocephalic and atraumatic.     Mouth/Throat:     Mouth: Mucous membranes are moist.     Pharynx: Oropharynx is clear.  Eyes:     Conjunctiva/sclera: Conjunctivae normal.  Neck:     Musculoskeletal: Neck supple.  Cardiovascular:     Rate and Rhythm: Normal rate and regular rhythm.     Pulses: Normal pulses.          Radial pulses are 2+ on the right side and 2+ on the left side.       Posterior tibial pulses are 2+ on the right side and 2+ on the left side.     Comments: Tactile temperature in the extremities appropriate and equal bilaterally. Pulmonary:     Effort: Pulmonary effort is normal. No respiratory distress.  Abdominal:     Tenderness: There is no guarding.  Musculoskeletal:     Right lower leg: No edema.     Left lower leg: No edema.  Lymphadenopathy:     Cervical: No cervical adenopathy.  Skin:    General: Skin is warm and dry.  Neurological:     Mental Status: She is alert.     Comments: Sensation grossly intact to light touch in the lower extremities bilaterally. No saddle anesthesias. Strength 5/5 in the bilateral lower extremities. Ambulatory without assistance or noted deficits.  Psychiatric:         Mood and Affect: Mood and affect normal.        Speech: Speech normal.        Behavior: Behavior normal.      ED Treatments / Results  Labs (all labs ordered are listed, but only abnormal  results are displayed) Labs Reviewed - No data to display  EKG None  Radiology No results found.  Procedures Procedures (including critical care time)  Medications Ordered in ED Medications  ketorolac (TORADOL) injection 30 mg (30 mg Intramuscular Given 06/19/19 1702)  methocarbamol (ROBAXIN) tablet 500 mg (500 mg Oral Given 06/19/19 1700)  predniSONE (DELTASONE) tablet 60 mg (60 mg Oral Given 06/19/19 1701)     Initial Impression / Assessment and Plan / ED Course  I have reviewed the triage vital signs and the nursing notes.  Pertinent labs & imaging results that were available during my care of the patient were reviewed by me and considered in my medical decision making (see chart for details).        Patient presents with several months of back pain.  I did not find any evidence of neurovascular compromise or indications of neurosurgical emergency. Recommend she follow-up with her neurosurgical specialist. The patient was given instructions for home care as well as return precautions. Patient voices understanding of these instructions, accepts the plan, and is comfortable with discharge.  Final Clinical Impressions(s) / ED Diagnoses   Final diagnoses:  Sciatica of left side    ED Discharge Orders         Ordered    methocarbamol (ROBAXIN) 500 MG tablet  2 times daily     06/19/19 1648    predniSONE (DELTASONE) 10 MG tablet  2 times daily with meals     06/19/19 1648    lidocaine (LIDODERM) 5 %  Every 24 hours     06/19/19 Opal, Shawn C, PA-C 06/21/19 [place], Ankit, MD 06/28/19 [phone]

## 2020-07-02 ENCOUNTER — Encounter (INDEPENDENT_AMBULATORY_CARE_PROVIDER_SITE_OTHER): Payer: Medicaid Other | Admitting: Ophthalmology

## 2020-07-23 ENCOUNTER — Other Ambulatory Visit: Payer: Self-pay

## 2020-07-23 ENCOUNTER — Ambulatory Visit (INDEPENDENT_AMBULATORY_CARE_PROVIDER_SITE_OTHER): Payer: Medicaid Other | Admitting: Ophthalmology

## 2020-07-23 ENCOUNTER — Encounter (INDEPENDENT_AMBULATORY_CARE_PROVIDER_SITE_OTHER): Payer: Self-pay | Admitting: Ophthalmology

## 2020-07-23 DIAGNOSIS — H35372 Puckering of macula, left eye: Secondary | ICD-10-CM | POA: Diagnosis not present

## 2020-07-23 DIAGNOSIS — Z8669 Personal history of other diseases of the nervous system and sense organs: Secondary | ICD-10-CM

## 2020-07-23 DIAGNOSIS — Z961 Presence of intraocular lens: Secondary | ICD-10-CM | POA: Diagnosis not present

## 2020-07-23 DIAGNOSIS — H43821 Vitreomacular adhesion, right eye: Secondary | ICD-10-CM

## 2020-07-23 NOTE — Progress Notes (Signed)
07/23/2020     CHIEF COMPLAINT Patient presents for Retina Follow Up   HISTORY OF PRESENT ILLNESS: Yesenia Howard is a 64 y.o. female who presents to the clinic today for:   HPI    Retina Follow Up    Patient presents with  Other.  In both eyes.  This started 1 year ago.  Severity is mild.  Duration of 1 year.  Since onset it is gradually worsening.          Comments    1 Year F/U OU   Pt c/o decreased VA OU since last visit. Pt c/o achiness OU off and on. Pt c/o continued intermittent "bubbles" OU. A1c: does not recall LBS: 107 2 days ago         Last edited by Rockie Neighbours, [place] on 07/23/2020  2:23 PM. (History)      Referring physician: Marguerita Merles, [place],  [place] [id number]  HISTORICAL INFORMATION:   Selected notes from the MEDICAL RECORD NUMBER    Lab Results  Component Value Date   HGBA1C (H) 08/08/2010    6.3 (NOTE)                                                                       According to the ADA Clinical Practice Recommendations for 2011, when HbA1c is used as a screening test:   >=6.5%   Diagnostic of Diabetes Mellitus           (if abnormal result  is confirmed)  5.7-6.4%   Increased risk of developing Diabetes Mellitus  References:Diagnosis and Classification of Diabetes Mellitus,Diabetes IRJJ,8841,66(AYTKZ 1):S62-S69 and Standards of Medical Care in         Diabetes - 2011,Diabetes SWFU,9323,55  (Suppl 1):S11-S61.     CURRENT MEDICATIONS: No current outpatient medications on file. (Ophthalmic Drugs)   No current facility-administered medications for this visit. (Ophthalmic Drugs)   Current Outpatient Medications (Other)  Medication Sig  . diazepam (VALIUM) 5 MG tablet Take 0.5 tablets (2.5 mg total) by mouth every 8 (eight) hours as needed for muscle spasms.  Marland Kitchen lidocaine (LIDODERM) 5 % Place 1 patch onto the skin daily. Remove & Discard patch within 12 hours or as directed by MD  . meloxicam (MOBIC) 7.5 MG  tablet Take 1 tablet (7.5 mg total) by mouth daily.  . methocarbamol (ROBAXIN) 500 MG tablet Take 1 tablet (500 mg total) by mouth 2 (two) times daily.  . SYMBICORT 80-4.5 MCG/ACT inhaler INHALE 2 PUFFS BY MOUTH INTO THE LUNGS TWICE DAILY  . traMADol (ULTRAM) 50 MG tablet TAKE 1 TABLET BY MOUTH EVERY 4 HOURS AS NEEDED FOR PAIN  . traMADol (ULTRAM) 50 MG tablet Take 1 tablet (50 mg total) by mouth every 6 (six) hours as needed.   No current facility-administered medications for this visit. (Other)      REVIEW OF SYSTEMS:    ALLERGIES No Known Allergies  PAST MEDICAL HISTORY Past Medical History:  Diagnosis Date  . Cataract   . Diabetes mellitus without complication ([place])    pt denies-states BS were elevated inthe past r/t prednisone-no DM meds  . Ovarian cyst   . Retinal detachment   . Sciatica  Past Surgical History:  Procedure Laterality Date  . CATARACT EXTRACTION    . CESAREAN SECTION    . EYE SURGERY    . OVARIAN CYST REMOVAL      FAMILY HISTORY Family History  Problem Relation Age of Onset  . Diabetes Mother   . Hypertension Mother   . Hypertension Sister     SOCIAL HISTORY Social History   Tobacco Use  . Smoking status: Never Smoker  . Smokeless tobacco: Never Used  Substance Use Topics  . Alcohol use: No    Alcohol/week: 0.0 standard drinks  . Drug use: No         OPHTHALMIC EXAM:  Base Eye Exam    Visual Acuity (ETDRS)      Right Left   Dist cc 20/20 20/20 -2   Correction: Glasses       Tonometry (Tonopen, 2:23 PM)      Right Left   Pressure 18 14       Pupils      Dark Light Shape React APD   Right 4 3 Round Brisk None   Left 5 4 Round Brisk None       Visual Fields (Counting fingers)      Left Right    Full Full       Extraocular Movement      Right Left    Full Full       Neuro/Psych    Oriented x3: Yes   Mood/Affect: Normal       Dilation    Both eyes: 1.0% Mydriacyl, 2.5% Phenylephrine @ 2:28 PM          Slit Lamp and Fundus Exam    External Exam      Right Left   External Normal Normal       Slit Lamp Exam      Right Left   Lids/Lashes Normal Normal   Conjunctiva/Sclera White and quiet White and quiet   Cornea Clear Clear   Anterior Chamber Deep and quiet Deep and quiet   Iris Round and reactive Round and reactive   Lens Centered posterior chamber intraocular lens Centered posterior chamber intraocular lens   Anterior Vitreous Normal Normal       Fundus Exam      Right Left   Posterior Vitreous Normal Clear vitrectomized   Disc Normal Normal   C/D Ratio 0.45 0.25   Macula Normal Normal   Vessels Normal Normal   Periphery No retinal breaks or tears Good laser retinopexy superiorly and temporally          IMAGING AND PROCEDURES  Imaging and Procedures for 07/23/20  OCT, Retina - OU - Both Eyes       Right Eye Quality was good. Scan locations included subfoveal. Central Foveal Thickness: 228. Findings include vitreomacular adhesion .   Left Eye Quality was good. Scan locations included subfoveal. Central Foveal Thickness: 243. Progression has been stable.   Notes OD, no active disease, incidental vitreal macular adhesion                ASSESSMENT/PLAN:  Left epiretinal membrane Minor clinical, no active topographic distortion      ICD-10-CM   1. 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 Left epiretinal membrane  H35.372 OCT, Retina - OU - Both Eyes  3. Pseudophakia  Z96.1   4. History of retinal detachment  Z86.69     1.  No active disease  OU, minor nontopographic distorting epiretinal membrane left eye nasal to the fovea    2. Incidental vitreomacular lesion right eye 3.  Ophthalmic Meds Ordered this visit:  No orders of the defined types were placed in this encounter.      Return in about 1 year (around 07/23/2021) for DILATE OU, OCT, COLOR FP.  There are no Patient Instructions on file for this  visit.   Explained the diagnoses, plan, and follow up with the patient and they expressed understanding.  Patient expressed understanding of the importance of proper follow up care.   Clent Demark Nasean Zapf M.D. Diseases & Surgery of the Retina and Vitreous Retina & Diabetic [place] 07/23/20     Abbreviations: M myopia (nearsighted); A astigmatism; H hyperopia (farsighted); P presbyopia; Mrx spectacle prescription;  CTL contact lenses; OD right eye; OS left eye; OU both eyes  XT exotropia; ET esotropia; PEK punctate epithelial keratitis; PEE punctate epithelial erosions; DES dry eye syndrome; MGD meibomian gland dysfunction; ATs artificial tears; PFAT's preservative free artificial tears; [place] nuclear sclerotic cataract; PSC posterior subcapsular cataract; ERM epi-retinal membrane; PVD posterior vitreous detachment; RD retinal detachment; DM diabetes mellitus; DR diabetic retinopathy; NPDR non-proliferative diabetic retinopathy; PDR proliferative diabetic retinopathy; CSME clinically significant macular edema; DME diabetic macular edema; dbh dot blot hemorrhages; CWS cotton wool spot; POAG primary open angle glaucoma; C/D cup-to-disc ratio; HVF humphrey visual field; GVF goldmann visual field; OCT optical coherence tomography; IOP intraocular pressure; BRVO Branch retinal vein occlusion; CRVO central retinal vein occlusion; CRAO central retinal artery occlusion; BRAO branch retinal artery occlusion; RT retinal tear; SB scleral buckle; PPV pars plana vitrectomy; VH Vitreous hemorrhage; PRP panretinal laser photocoagulation; IVK intravitreal kenalog; VMT vitreomacular traction; MH Macular hole;  NVD neovascularization of the disc; NVE neovascularization elsewhere; AREDS age related eye disease study; ARMD age related macular degeneration; POAG primary open angle glaucoma; EBMD epithelial/anterior basement membrane dystrophy; ACIOL anterior chamber intraocular lens; IOL intraocular lens; PCIOL posterior chamber  intraocular lens; Phaco/IOL phacoemulsification with intraocular lens placement; [place] photorefractive keratectomy; LASIK laser assisted in situ keratomileusis; HTN hypertension; DM diabetes mellitus; COPD chronic obstructive pulmonary disease

## 2020-07-23 NOTE — Assessment & Plan Note (Signed)
Minor clinical, no active topographic distortion

## 2021-07-29 ENCOUNTER — Ambulatory Visit (INDEPENDENT_AMBULATORY_CARE_PROVIDER_SITE_OTHER): Payer: Medicaid Other | Admitting: Ophthalmology

## 2021-07-29 ENCOUNTER — Other Ambulatory Visit: Payer: Self-pay

## 2021-07-29 ENCOUNTER — Encounter (INDEPENDENT_AMBULATORY_CARE_PROVIDER_SITE_OTHER): Payer: Self-pay | Admitting: Ophthalmology

## 2021-07-29 DIAGNOSIS — H35372 Puckering of macula, left eye: Secondary | ICD-10-CM

## 2021-07-29 DIAGNOSIS — H43821 Vitreomacular adhesion, right eye: Secondary | ICD-10-CM

## 2021-07-29 DIAGNOSIS — E119 Type 2 diabetes mellitus without complications: Secondary | ICD-10-CM

## 2021-07-29 DIAGNOSIS — Z8669 Personal history of other diseases of the nervous system and sense organs: Secondary | ICD-10-CM | POA: Diagnosis not present

## 2021-07-29 NOTE — Assessment & Plan Note (Signed)
Minor no impact on acuity 

## 2021-07-29 NOTE — Assessment & Plan Note (Signed)
No detectable diabetic retinopathy 

## 2021-07-29 NOTE — Progress Notes (Signed)
07/29/2021     CHIEF COMPLAINT Patient presents for  Chief Complaint  Patient presents with   Retina Follow Up      HISTORY OF PRESENT ILLNESS: Yesenia Howard is a 65 y.o. female who presents to the clinic today for:   HPI     Retina Follow Up           Diagnosis: Other   Laterality: both eyes   Onset: 1 year ago   Severity: mild   Duration: 1 year   Course: gradually worsening         Comments   1 yr fu OU oct fp. Pt states "I have been having headaches, sometimes my left eye hurts around the eye lid and eyebrow and I can't see well out of that eye even after the implants." Denies new FOL or floaters.       Last edited by Laurin Coder on 07/29/2021  1:05 PM.      Referring physician: Clent Jacks, MD [address] [place],  [place] [address]  HISTORICAL INFORMATION:   Selected notes from the MEDICAL RECORD NUMBER    Lab Results  Component Value Date   HGBA1C (H) 08/08/2010    6.3 (NOTE)                                                                       According to the ADA Clinical Practice Recommendations for 2011, when HbA1c is used as a screening test:   >=6.5%   Diagnostic of Diabetes Mellitus           (if abnormal result  is confirmed)  5.7-6.4%   Increased risk of developing Diabetes Mellitus  References:Diagnosis and Classification of Diabetes Mellitus,Diabetes GNFA,2130,86(VHQIO 1):S62-S69 and Standards of Medical Care in         Diabetes - 2011,Diabetes NGEX,5284,13  (Suppl 1):S11-S61.     CURRENT MEDICATIONS: No current outpatient medications on file. (Ophthalmic Drugs)   No current facility-administered medications for this visit. (Ophthalmic Drugs)   Current Outpatient Medications (Other)  Medication Sig   diazepam (VALIUM) 5 MG tablet Take 0.5 tablets (2.5 mg total) by mouth every 8 (eight) hours as needed for muscle spasms.   lidocaine (LIDODERM) 5 % Place 1 patch onto the skin daily. Remove & Discard patch within 12  hours or as directed by MD   meloxicam (MOBIC) 7.5 MG tablet Take 1 tablet (7.5 mg total) by mouth daily.   methocarbamol (ROBAXIN) 500 MG tablet Take 1 tablet (500 mg total) by mouth 2 (two) times daily.   SYMBICORT 80-4.5 MCG/ACT inhaler INHALE 2 PUFFS BY MOUTH INTO THE LUNGS TWICE DAILY   traMADol (ULTRAM) 50 MG tablet TAKE 1 TABLET BY MOUTH EVERY 4 HOURS AS NEEDED FOR PAIN   traMADol (ULTRAM) 50 MG tablet Take 1 tablet (50 mg total) by mouth every 6 (six) hours as needed.   No current facility-administered medications for this visit. (Other)      REVIEW OF SYSTEMS:    ALLERGIES No Known Allergies  PAST MEDICAL HISTORY Past Medical History:  Diagnosis Date   Cataract    Diabetes mellitus without complication ([place])    pt denies-states BS were elevated inthe past r/t  prednisone-no DM meds   Ovarian cyst    Retinal detachment    Sciatica    Past Surgical History:  Procedure Laterality Date   CATARACT EXTRACTION     CESAREAN SECTION     EYE SURGERY     OVARIAN CYST REMOVAL      FAMILY HISTORY Family History  Problem Relation Age of Onset   Diabetes Mother    Hypertension Mother    Hypertension Sister     SOCIAL HISTORY Social History   Tobacco Use   Smoking status: Never   Smokeless tobacco: Never  Substance Use Topics   Alcohol use: No    Alcohol/week: 0.0 standard drinks   Drug use: No         OPHTHALMIC EXAM:  Base Eye Exam     Visual Acuity (ETDRS)       Right Left   Dist [place] 20/20 20/40   Dist ph [place]  20/30 -1+2         Tonometry (Tonopen, 1:09 PM)       Right Left   Pressure 16 9         Pupils       Pupils Dark Light APD   Right PERRL 4 3 None   Left PERRL 5 4 None         Visual Fields (Counting fingers)       Left Right    Full Full         Extraocular Movement       Right Left    Full Full         Neuro/Psych     Oriented x3: Yes   Mood/Affect: Normal         Dilation     Both eyes: 1.0%  Mydriacyl, 2.5% Phenylephrine @ 1:09 PM           Slit Lamp and Fundus Exam     External Exam       Right Left   External Normal Normal         Slit Lamp Exam       Right Left   Lids/Lashes Normal Normal   Conjunctiva/Sclera White and quiet White and quiet   Cornea Clear Clear   Anterior Chamber Deep and quiet Deep and quiet   Iris Round and reactive Round and reactive   Lens Centered posterior chamber intraocular lens Centered posterior chamber intraocular lens   Anterior Vitreous Normal Normal         Fundus Exam       Right Left   Posterior Vitreous Normal Clear vitrectomized   Disc Normal Normal   C/D Ratio 0.45 0.25   Macula Normal Normal   Vessels Normal Normal   Periphery No retinal breaks or tears Good laser retinopexy superiorly and temporally            IMAGING AND PROCEDURES  Imaging and Procedures for 07/29/21  Color Fundus Photography Optos - OU - Both Eyes       Right Eye Disc findings include normal observations. Macula : normal observations. Vessels : normal observations. Periphery : normal observations.   Left Eye Progression has been stable. Disc findings include normal observations. Macula : normal observations.   Notes Clear media OU, evidence of CHRPE like chorioretinal scar temporal to macula OS, with good peripheral chorioretinal scarring     OCT, Retina - OU - Both Eyes       Right Eye Quality was good. Scan locations  included subfoveal. Central Foveal Thickness: 226. Findings include vitreomacular adhesion .   Left Eye Quality was good. Scan locations included subfoveal. Central Foveal Thickness: 235. Progression has been stable. Findings include retinal drusen , no IRF, no SRF.   Notes OD, no active disease, incidental vitreal macular adhesion  OU with thickening of Bruch's membrane             ASSESSMENT/PLAN:  History of retinal detachment Good peripheral chorioretinal scarring stable since  2013  Left epiretinal membrane Minor no impact on acuity  Diabetes mellitus without complication No detectable diabetic retinopathy  Vitreomacular adhesion, right Physiologic OD no distortion     ICD-10-CM   1. Vitreomacular adhesion, right  H43.821 Color Fundus Photography Optos - OU - Both Eyes    OCT, Retina - OU - Both Eyes    2. Left epiretinal membrane  H35.372 Color Fundus Photography Optos - OU - Both Eyes    OCT, Retina - OU - Both Eyes    3. History of retinal detachment  Z86.69     4. Diabetes mellitus without complication (HCC)  K81.2       1.  OS, looks great status post retinal detachment pair some 9 years previous.  2.  No detectable diabetic retinopathy in either eye  3.  Clinical ERM but no impact on acuity OS observe  Patient scheduled to see Dr. Katy Fitch in 2 years follow-up here in 1 year or there in 1 year and and thereafter see each of [place] every 2 years but separated by 1 year piece  Ophthalmic Meds Ordered this visit:  No orders of the defined types were placed in this encounter.      Return in about 2 years (around 07/30/2023) for DILATE OU, COLOR FP, OCT.  There are no Patient Instructions on file for this visit.   Explained the diagnoses, plan, and follow up with the patient and they expressed understanding.  Patient expressed understanding of the importance of proper follow up care.   Clent Demark Finola Rosal M.D. Diseases & Surgery of the Retina and Vitreous [facility]ina [facility] [place] 07/29/21     Abbreviations: M myopia (nearsighted); A astigmatism; H hyperopia (farsighted); P presbyopia; Mrx spectacle prescription;  CTL contact lenses; OD right eye; OS left eye; OU both eyes  XT exotropia; ET esotropia; PEK punctate epithelial keratitis; PEE punctate epithelial erosions; DES dry eye syndrome; MGD meibomian gland dysfunction; ATs artificial tears; PFAT's preservative free artificial tears; [place] nuclear sclerotic cataract; PSC posterior subcapsular  cataract; ERM epi-retinal membrane; PVD posterior vitreous detachment; RD retinal detachment; DM diabetes mellitus; DR diabetic retinopathy; NPDR non-proliferative diabetic retinopathy; PDR proliferative diabetic retinopathy; CSME clinically significant macular edema; DME diabetic macular edema; dbh dot blot hemorrhages; CWS cotton wool spot; POAG primary open angle glaucoma; C/D cup-to-disc ratio; HVF humphrey visual field; GVF goldmann visual field; OCT optical coherence tomography; IOP intraocular pressure; BRVO Branch retinal vein occlusion; CRVO central retinal vein occlusion; CRAO central retinal artery occlusion; BRAO branch retinal artery occlusion; RT retinal tear; SB scleral buckle; PPV pars plana vitrectomy; VH Vitreous hemorrhage; PRP panretinal laser photocoagulation; IVK intravitreal kenalog; VMT vitreomacular traction; MH Macular hole;  NVD neovascularization of the disc; NVE neovascularization elsewhere; AREDS age related eye disease study; ARMD age related macular degeneration; POAG primary open angle glaucoma; EBMD epithelial/anterior basement membrane dystrophy; ACIOL anterior chamber intraocular lens; IOL intraocular lens; PCIOL posterior chamber intraocular lens; Phaco/IOL phacoemulsification with intraocular lens placement; PRK photorefractive keratectomy; LASIK laser assisted in situ  keratomileusis; HTN hypertension; DM diabetes mellitus; COPD chronic obstructive pulmonary disease

## 2021-07-29 NOTE — Assessment & Plan Note (Signed)
Physiologic OD no distortion

## 2021-07-29 NOTE — Assessment & Plan Note (Signed)
Good peripheral chorioretinal scarring stable since 2013

## 2022-01-17 ENCOUNTER — Other Ambulatory Visit: Payer: Self-pay | Admitting: Student

## 2022-01-17 DIAGNOSIS — M5412 Radiculopathy, cervical region: Secondary | ICD-10-CM

## 2022-01-17 DIAGNOSIS — M5416 Radiculopathy, lumbar region: Secondary | ICD-10-CM

## 2022-02-22 ENCOUNTER — Ambulatory Visit
Admission: RE | Admit: 2022-02-22 | Discharge: 2022-02-22 | Disposition: A | Discharge status: Home / Self Care | Payer: Medicaid Other | Source: Ambulatory Visit | Attending: Student | Admitting: Student

## 2022-02-22 DIAGNOSIS — M5412 Radiculopathy, cervical region: Secondary | ICD-10-CM

## 2022-02-22 DIAGNOSIS — M5416 Radiculopathy, lumbar region: Secondary | ICD-10-CM

## 2022-11-03 ENCOUNTER — Encounter (INDEPENDENT_AMBULATORY_CARE_PROVIDER_SITE_OTHER): Payer: Medicaid Other | Admitting: Ophthalmology

## 2024-01-05 ENCOUNTER — Other Ambulatory Visit: Payer: Self-pay | Admitting: Nurse Practitioner

## 2024-01-05 DIAGNOSIS — Z1231 Encounter for screening mammogram for malignant neoplasm of breast: Secondary | ICD-10-CM

## 2024-02-25 ENCOUNTER — Encounter (HOSPITAL_BASED_OUTPATIENT_CLINIC_OR_DEPARTMENT_OTHER): Payer: Self-pay

## 2024-02-25 ENCOUNTER — Ambulatory Visit (HOSPITAL_BASED_OUTPATIENT_CLINIC_OR_DEPARTMENT_OTHER): Admitting: Family Medicine

## 2024-02-25 ENCOUNTER — Ambulatory Visit (HOSPITAL_BASED_OUTPATIENT_CLINIC_OR_DEPARTMENT_OTHER)
Admission: EM | Admit: 2024-02-25 | Discharge: 2024-02-25 | Disposition: A | Discharge status: Home / Self Care | Attending: Family Medicine | Admitting: Family Medicine

## 2024-02-25 DIAGNOSIS — R300 Dysuria: Secondary | ICD-10-CM | POA: Insufficient documentation

## 2024-02-25 DIAGNOSIS — M25511 Pain in right shoulder: Secondary | ICD-10-CM | POA: Diagnosis present

## 2024-02-25 DIAGNOSIS — N390 Urinary tract infection, site not specified: Secondary | ICD-10-CM | POA: Diagnosis not present

## 2024-02-25 DIAGNOSIS — M79601 Pain in right arm: Secondary | ICD-10-CM | POA: Diagnosis present

## 2024-02-25 LAB — POCT URINALYSIS DIP (MANUAL ENTRY)
Bilirubin, UA: NEGATIVE
Glucose, UA: NEGATIVE mg/dL
Ketones, POC UA: NEGATIVE mg/dL
Nitrite, UA: POSITIVE — AB
Protein Ur, POC: >=300 mg/dL — AB
Spec Grav, UA: 1.025 (ref 1.010–1.025)
Urobilinogen, UA: 1 U/dL
pH, UA: 6.5 (ref 5.0–8.0)

## 2024-02-25 MED ORDER — NITROFURANTOIN MONOHYD MACRO 100 MG PO CAPS: 100.0000 mg | ORAL_CAPSULE | Freq: Two times a day (BID) | ORAL | 0 refills | Status: AC

## 2024-02-25 NOTE — ED Triage Notes (Addendum)
 Right arm pain x 4 months without injury. Pain to right shoulder. States ROM affected. Also having burning with urination x 4 weeks. States has been seen at pcp's office and no one is doing anything. Was seen at ER recently (6/22) for arm pain and prescribed steroids and toradol . No x-rays done. States pain medication not helping.

## 2024-02-25 NOTE — Discharge Instructions (Signed)
 Treating you for UTI.  Drink plenty of water.

## 2024-02-25 NOTE — ED Provider Notes (Signed)
 [facility]    CSN: [id number] Arrival date & time: 02/25/24  0816      History   Chief Complaint Chief Complaint  Patient presents with   Arm Pain   Dysuria    HPI Yesenia Howard is a 68 y.o. female.   Patient is a 68 year old female that presents today with complaints of dysuria, urinary frequency for approximately 4 weeks.  Symptoms have been constant.  Denies any fevers, chills, back pain.  She has been drinking cranberry juice without any relief.    Arm Pain  Dysuria   Past Medical History:  Diagnosis Date   Cataract    Diabetes mellitus without complication (HCC)    pt denies-states BS were elevated inthe past r/t prednisone -no DM meds   Ovarian cyst    Retinal detachment    Sciatica     Patient Active Problem List   Diagnosis Date Noted   Left epiretinal membrane 07/23/2020   Pseudophakia 07/23/2020   Vitreomacular adhesion, right 07/23/2020   History of retinal detachment 07/23/2020   IBS (irritable bowel syndrome) > atypical /migratory cp 05/20/2017   Cough variant asthma vs UACS 04/29/2017   Morbid obesity due to excess calories (HCC) 04/29/2017   Poorly controlled intermittent asthma with acute exacerbation 10/18/2014   Diabetes mellitus without complication (HCC) 07/29/2014   Elevated blood-pressure reading without diagnosis of hypertension 07/29/2014   Bronchitis 07/28/2014   Dyspnea on exertion 07/28/2014    Past Surgical History:  Procedure Laterality Date   CATARACT EXTRACTION     CESAREAN SECTION     EYE SURGERY     OVARIAN CYST REMOVAL      OB History     Gravida  3   Para  1   Term  1   Preterm      AB  2   Living  1      SAB      IAB  2   Ectopic      Multiple      Live Births  1            Home Medications    Prior to Admission medications   Medication Sig Start Date End Date Taking? Authorizing Provider  atorvastatin (LIPITOR) 80 MG tablet Take 80 mg by mouth daily. 12/14/23  Yes  [provider]  Cholecalciferol 1.25 MG (50000 UT) capsule Take 1 capsule by mouth once a week. 12/15/23  Yes [provider]  conjugated estrogens (PREMARIN) vaginal cream Place 1 applicator vaginally daily. 10/28/21  Yes [provider]  cyclobenzaprine (FLEXERIL) 10 MG tablet Take 10 mg by mouth 3 (three) times daily as needed for muscle spasms. 12/09/23  Yes [provider]  hydrochlorothiazide (HYDRODIURIL) 25 MG tablet Take 1 tablet by mouth daily. 12/14/23  Yes [provider]  ketorolac  (TORADOL ) 10 MG tablet Take 10 mg by mouth every 8 (eight) hours as needed. 02/07/24  Yes [provider]  losartan (COZAAR) 100 MG tablet Take 1 tablet by mouth daily. 12/14/23  Yes [provider]  metFORMIN  (GLUCOPHAGE -XR) 500 MG 24 hr tablet Take 500 mg by mouth 2 (two) times daily. 01/04/24  Yes [provider]  nitrofurantoin , macrocrystal-monohydrate, (MACROBID ) 100 MG capsule Take 1 capsule (100 mg total) by mouth 2 (two) times daily. 02/25/24  Yes Ana Liaw A, FNP  lidocaine  (LIDODERM ) 5 % Place 1 patch onto the skin daily. Remove & Discard patch within 12 hours or as directed by MD 06/19/19  Joy, Shawn C, PA-C  meloxicam  (MOBIC ) 7.5 MG tablet Take 1 tablet (7.5 mg total) by mouth daily. 08/12/17   Loetta Senior, MD  SYMBICORT  80-4.5 MCG/ACT inhaler INHALE 2 PUFFS BY MOUTH INTO THE LUNGS TWICE DAILY 05/11/18   Darlean Ozell NOVAK, MD    Family History Family History  Problem Relation Age of Onset   Diabetes Mother    Hypertension Mother    Hypertension Sister     Social History Social History   Tobacco Use   Smoking status: Never   Smokeless tobacco: Never  Substance Use Topics   Alcohol use: No    Alcohol/week: 0.0 standard drinks of alcohol   Drug use: No     Allergies   Patient has no known allergies.   Review of Systems Review of Systems  Genitourinary:  Positive for dysuria.  See HPI   Physical  Exam Triage Vital Signs ED Triage Vitals  Encounter Vitals Group     BP 02/25/24 0825 (!) 148/91     Girls Systolic BP Percentile --      Girls Diastolic BP Percentile --      Boys Systolic BP Percentile --      Boys Diastolic BP Percentile --      Pulse Rate 02/25/24 0825 93     Resp 02/25/24 0825 20     Temp 02/25/24 0825 98.3 F (36.8 C)     Temp Source 02/25/24 0825 Oral     SpO2 02/25/24 0825 97 %     Weight --      Height --      Head Circumference --      Peak Flow --      Pain Score 02/25/24 0827 10     Pain Loc --      Pain Education --      Exclude from Growth Chart --    No data found.  Updated Vital Signs BP (!) 148/91 (BP Location: Left Arm)   Pulse 93   Temp 98.3 F (36.8 C) (Oral)   Resp 20   SpO2 97%   Visual Acuity Right Eye Distance:   Left Eye Distance:   Bilateral Distance:    Right Eye Near:   Left Eye Near:    Bilateral Near:     Physical Exam Vitals and nursing note reviewed.  Constitutional:      General: She is not in acute distress.    Appearance: Normal appearance. She is not ill-appearing, toxic-appearing or diaphoretic.  Pulmonary:     Effort: Pulmonary effort is normal.  Neurological:     Mental Status: She is alert.  Psychiatric:        Mood and Affect: Mood normal.      UC Treatments / Results  Labs (all labs ordered are listed, but only abnormal results are displayed) Labs Reviewed  POCT URINALYSIS DIP (MANUAL ENTRY) - Abnormal; Notable for the following components:      Result Value   Color, UA orange (*)    Clarity, UA cloudy (*)    Blood, UA moderate (*)    Protein Ur, POC >=300 (*)    Nitrite, UA Positive (*)    Leukocytes, UA Small (1+) (*)    All other components within normal limits  URINE CULTURE    EKG   Radiology No results found.  Procedures Procedures (including critical care time)  Medications Ordered in UC Medications - No data to display  Initial Impression / Assessment and Plan /  UC  Course  I have reviewed the triage vital signs and the nursing notes.  Pertinent labs & imaging results that were available during my care of the patient were reviewed by me and considered in my medical decision making (see chart for details).     Dysuria-urine with small leuks, positive nitrites, protein, cloudy and moderate blood.  Consistent with urinary tract infection.  Sending for culture.  Will go ahead and treat with antibiotics at this time.  Treating with Macrobid  Recommend increase fluid intake Follow-up as needed for any continued issues Final Clinical Impressions(s) / UC Diagnoses   Final diagnoses:  Dysuria     Discharge Instructions      Treating you for UTI.  Drink plenty of water.     ED Prescriptions     Medication Sig Dispense Auth. Provider   nitrofurantoin , macrocrystal-monohydrate, (MACROBID ) 100 MG capsule Take 1 capsule (100 mg total) by mouth 2 (two) times daily. 10 capsule Adah Wilbert LABOR, FNP      PDMP not reviewed this encounter.   Adah Wilbert LABOR, FNP 02/25/24 1531

## 2024-02-28 LAB — URINE CULTURE: Culture: >=100000 — AB

## 2024-02-29 ENCOUNTER — Ambulatory Visit (HOSPITAL_COMMUNITY): Payer: Self-pay

## 2024-03-30 ENCOUNTER — Other Ambulatory Visit: Payer: Self-pay

## 2024-03-30 ENCOUNTER — Emergency Department (HOSPITAL_COMMUNITY)
Admission: EM | Admit: 2024-03-30 | Discharge: 2024-03-30 | Disposition: A | Discharge status: Home / Self Care | Attending: Emergency Medicine | Admitting: Emergency Medicine

## 2024-03-30 ENCOUNTER — Encounter (HOSPITAL_COMMUNITY): Payer: Self-pay

## 2024-03-30 DIAGNOSIS — J45909 Unspecified asthma, uncomplicated: Secondary | ICD-10-CM | POA: Diagnosis not present

## 2024-03-30 DIAGNOSIS — I10 Essential (primary) hypertension: Secondary | ICD-10-CM | POA: Diagnosis not present

## 2024-03-30 DIAGNOSIS — E119 Type 2 diabetes mellitus without complications: Secondary | ICD-10-CM | POA: Insufficient documentation

## 2024-03-30 DIAGNOSIS — Z7951 Long term (current) use of inhaled steroids: Secondary | ICD-10-CM | POA: Insufficient documentation

## 2024-03-30 DIAGNOSIS — M130 Polyarthritis, unspecified: Secondary | ICD-10-CM | POA: Insufficient documentation

## 2024-03-30 DIAGNOSIS — Z7984 Long term (current) use of oral hypoglycemic drugs: Secondary | ICD-10-CM | POA: Insufficient documentation

## 2024-03-30 DIAGNOSIS — Z79899 Other long term (current) drug therapy: Secondary | ICD-10-CM | POA: Diagnosis not present

## 2024-03-30 DIAGNOSIS — N3000 Acute cystitis without hematuria: Secondary | ICD-10-CM

## 2024-03-30 DIAGNOSIS — M2550 Pain in unspecified joint: Secondary | ICD-10-CM

## 2024-03-30 DIAGNOSIS — M79621 Pain in right upper arm: Secondary | ICD-10-CM | POA: Diagnosis present

## 2024-03-30 LAB — COMPREHENSIVE METABOLIC PANEL WITH GFR
ALT: 12 U/L (ref 0–44)
AST: 15 U/L (ref 15–41)
Albumin: 3.7 g/dL (ref 3.5–5.0)
Alkaline Phosphatase: 102 U/L (ref 38–126)
Anion gap: 10 (ref 5–15)
BUN: 10 mg/dL (ref 8–23)
CO2: 26 mmol/L (ref 22–32)
Calcium: 9.6 mg/dL (ref 8.9–10.3)
Chloride: 104 mmol/L (ref 98–111)
Creatinine, Ser: 0.66 mg/dL (ref 0.44–1.00)
GFR, Estimated: >60 mL/min (ref >60)
Glucose, Bld: 153 mg/dL — ABNORMAL HIGH (ref 70–99)
Potassium: 3.9 mmol/L (ref 3.5–5.1)
Sodium: 140 mmol/L (ref 135–145)
Total Bilirubin: 0.9 mg/dL (ref 0.0–1.2)
Total Protein: 7.7 g/dL (ref 6.5–8.1)

## 2024-03-30 LAB — URINALYSIS, ROUTINE W REFLEX MICROSCOPIC
Bilirubin Urine: NEGATIVE
Glucose, UA: NEGATIVE mg/dL
Hgb urine dipstick: NEGATIVE
Ketones, ur: NEGATIVE mg/dL
Nitrite: NEGATIVE
Protein, ur: 100 mg/dL — AB
Specific Gravity, Urine: 1.016 (ref 1.005–1.030)
WBC, UA: >50 WBC/hpf (ref 0–5)
pH: 5 (ref 5.0–8.0)

## 2024-03-30 LAB — CBC WITH DIFFERENTIAL/PLATELET
Abs Immature Granulocytes: 0 K/uL (ref 0.00–0.07)
Basophils Absolute: 0 K/uL (ref 0.0–0.1)
Basophils Relative: 1 %
Eosinophils Absolute: 0 K/uL (ref 0.0–0.5)
Eosinophils Relative: 0 %
HCT: 37.4 % (ref 36.0–46.0)
Hemoglobin: 11.6 g/dL — ABNORMAL LOW (ref 12.0–15.0)
Immature Granulocytes: 0 %
Lymphocytes Relative: 30 %
Lymphs Abs: 1.4 K/uL (ref 0.7–4.0)
MCH: 28.5 pg (ref 26.0–34.0)
MCHC: 31 g/dL (ref 30.0–36.0)
MCV: 91.9 fL (ref 80.0–100.0)
Monocytes Absolute: 0.4 K/uL (ref 0.1–1.0)
Monocytes Relative: 8 %
Neutro Abs: 2.7 K/uL (ref 1.7–7.7)
Neutrophils Relative %: 61 %
Platelets: 249 K/uL (ref 150–400)
RBC: 4.07 MIL/uL (ref 3.87–5.11)
RDW: 12.7 % (ref 11.5–15.5)
WBC: 4.5 K/uL (ref 4.0–10.5)
nRBC: 0 % (ref 0.0–0.2)

## 2024-03-30 MED ORDER — CELECOXIB 100 MG PO CAPS: 100.0000 mg | ORAL_CAPSULE | Freq: Two times a day (BID) | ORAL | 0 refills | Status: AC

## 2024-03-30 MED ORDER — KETOROLAC TROMETHAMINE 15 MG/ML IJ SOLN
15.0000 mg | Freq: Once | INTRAMUSCULAR | Status: AC
  Administered 2024-03-30 (×2): 15 mg via INTRAVENOUS
  Filled 2024-03-30: qty 1

## 2024-03-30 NOTE — ED Triage Notes (Signed)
 Pt presents to ED from home C/O L leg pain and R arm pain X 2 days. Pt states her leg pain is getting worse to the point that she can hardly bear weight this morning. Denies known injury.

## 2024-03-30 NOTE — ED Provider Notes (Addendum)
 [place] EMERGENCY DEPARTMENT AT [facility] Provider Note   CSN: [id number] Arrival date & time: 03/30/24  9095     Patient presents with: Leg Pain   Yesenia Howard is a 68 y.o. female who presents to the ED today with complaints of bilateral upper extremity pain as well as left lower hip/lower extremity discomfort.  She has previous notable diagnoses of irritable bowel syndrome, hypertension, asthma, type 2 diabetes.  She states that the pain that she is experiencing in the upper extremities is chronic over the last several days has been getting worse.  Seen at [facility] for the same approximately a month ago stating that she received injected as well as oral steroids which provide some relief for the time however she again has pain.  Regarding the pain in the left lower extremity, she states that over the last several days she has decreased ability to walk secondary to pain with weightbearing.  Denies having any recent falls or injuries.    Leg Pain      Prior to Admission medications   Medication Sig Start Date End Date Taking? Authorizing Provider  celecoxib  (CELEBREX ) 100 MG capsule Take 1 capsule (100 mg total) by mouth 2 (two) times daily. 03/30/24  Yes Myriam Dorn BROCKS, PA  atorvastatin (LIPITOR) 80 MG tablet Take 80 mg by mouth daily. 12/14/23   [provider]  Cholecalciferol 1.25 MG (50000 UT) capsule Take 1 capsule by mouth once a week. 12/15/23   [provider]  conjugated estrogens (PREMARIN) vaginal cream Place 1 applicator vaginally daily. 10/28/21   [provider]  cyclobenzaprine (FLEXERIL) 10 MG tablet Take 10 mg by mouth 3 (three) times daily as needed for muscle spasms. 12/09/23   [provider]  hydrochlorothiazide (HYDRODIURIL) 25 MG tablet Take 1 tablet by mouth daily. 12/14/23   [provider]  ketorolac  (TORADOL ) 10 MG tablet Take 10 mg by mouth every 8 (eight) hours as needed. 02/07/24    [provider]  lidocaine  (LIDODERM ) 5 % Place 1 patch onto the skin daily. Remove & Discard patch within 12 hours or as directed by MD 06/19/19   Joy, Shawn C, PA-C  losartan (COZAAR) 100 MG tablet Take 1 tablet by mouth daily. 12/14/23   [provider]  meloxicam  (MOBIC ) 7.5 MG tablet Take 1 tablet (7.5 mg total) by mouth daily. 08/12/17   Loetta Senior, MD  metFORMIN  (GLUCOPHAGE -XR) 500 MG 24 hr tablet Take 500 mg by mouth 2 (two) times daily. 01/04/24   [provider]  nitrofurantoin , macrocrystal-monohydrate, (MACROBID ) 100 MG capsule Take 1 capsule (100 mg total) by mouth 2 (two) times daily. 02/25/24   Adah Wilbert LABOR, FNP  SYMBICORT  80-4.5 MCG/ACT inhaler INHALE 2 PUFFS BY MOUTH INTO THE LUNGS TWICE DAILY 05/11/18   Wert, Michael B, MD    Allergies: Patient has no known allergies.    Review of Systems  Musculoskeletal:  Positive for arthralgias and myalgias.  All other systems reviewed and are negative.   Updated Vital Signs BP 139/74   Pulse 78   Temp 98.3 F (36.8 C)   Resp 17   Ht 5' 9 (1.753 m)   Wt (!) 142.9 kg   SpO2 100%   BMI 46.52 kg/m   Physical Exam Vitals and nursing note reviewed.  Constitutional:      General: She is not in acute distress.    Appearance: Normal appearance.  HENT:     Head: Normocephalic and atraumatic.  Mouth/Throat:     Mouth: Mucous membranes are moist.     Pharynx: Oropharynx is clear.  Eyes:     Extraocular Movements: Extraocular movements intact.     Conjunctiva/sclera: Conjunctivae normal.     Pupils: Pupils are equal, round, and reactive to light.  Cardiovascular:     Rate and Rhythm: Normal rate and regular rhythm.     Pulses: Normal pulses.     Heart sounds: Normal heart sounds. No murmur heard.    No friction rub. No gallop.  Pulmonary:     Effort: Pulmonary effort is normal.     Breath sounds: Normal breath sounds.  Abdominal:     General: Abdomen is flat. Bowel sounds are normal.      Palpations: Abdomen is soft.  Musculoskeletal:        General: Normal range of motion.     Right shoulder: Normal.     Left shoulder: Normal.     Right upper arm: Normal.     Left upper arm: Normal.     Right hand: Normal.     Left hand: Normal.     Cervical back: Normal range of motion and neck supple.     Right hip: Normal.     Left hip: Normal.     Right knee: Normal.     Left knee: Normal.     Right lower leg: No edema.     Left lower leg: No edema.  Skin:    General: Skin is warm and dry.     Capillary Refill: Capillary refill takes less than 2 seconds.  Neurological:     General: No focal deficit present.     Mental Status: She is alert. Mental status is at baseline.  Psychiatric:        Mood and Affect: Mood normal.     (all labs ordered are listed, but only abnormal results are displayed) Labs Reviewed  COMPREHENSIVE METABOLIC PANEL WITH GFR - Abnormal; Notable for the following components:      Result Value   Glucose, Bld 153 (*)    All other components within normal limits  CBC WITH DIFFERENTIAL/PLATELET - Abnormal; Notable for the following components:   Hemoglobin 11.6 (*)    All other components within normal limits  URINALYSIS, ROUTINE W REFLEX MICROSCOPIC    EKG: None  Radiology: No results found.   Procedures   Medications Ordered in the ED  ketorolac  (TORADOL ) 15 MG/ML injection 15 mg (15 mg Intravenous Given 03/30/24 1209)    Clinical Course as of 03/30/24 1720  Wed Mar 30, 2024  1719 Urinalysis, Routine w reflex microscopic -Urine, Clean Catch [JG]    Clinical Course User Index [JG] Myriam Dorn BROCKS, PA                                 Medical Decision Making Amount and/or Complexity of Data Reviewed Labs: ordered. Decision-making details documented in ED Course.  Risk Prescription drug management.   Medical Decision Making:   Yesenia Howard is a 68 y.o. female who presented to the ED today with global joint pain detailed  above.    External chart has been reviewed including previous labs and imaging. Complete initial physical exam performed, notably the patient  was awake alert in no apparent distress.  Physical exam is largely unremarkable..    Reviewed and confirmed nursing documentation for past medical history, family history, social history.  Initial Assessment:   With the patient's presentation of joint pain, most likely diagnosis is pain secondary to osteoarthritis.  Further differential diagnosis for this patient includes possible viral syndrome, acute infective process, other degenerative/rheumatic disorder.  Initial Plan:  Provide initial pain management with ketorolac  IV. Screening labs including CBC and Metabolic panel to evaluate for infectious or metabolic etiology of disease.  Urinalysis with reflex culture ordered to evaluate for UTI or relevant urologic/nephrologic pathology. Objective evaluation as below reviewed   Initial Study Results:   Laboratory  All laboratory results reviewed without evidence of clinically relevant pathology.   Exceptions include: None    Reassessment and Plan:   After assessment of this patient, she had a complete resolution of her pain with ketorolac  injection.  As such we will prescribe celecoxib  for continued management of her pain and have her follow-up with her primary care for continued management and possible imaging related to her arthritis.  She felt like she had a UTI, so we will obtain a UA and reassess, but likely discharge pending results.  Plan was to hand care to oncoming provider and have patient discharged w/ antibiotics if needed.  Patient was discharged by nursing staff prior to this occurring, so did not receive prescription for her infection prior to discharge.   As results were given after 2 hours post-discharge, called in prescription for cephalexin  500 mg QID x7 days to manage her UTI.       Final diagnoses:  Polyarthralgia    ED  Discharge Orders          Ordered    celecoxib  (CELEBREX ) 100 MG capsule  2 times daily        03/30/24 1525               Myriam Dorn BROCKS, [place] 03/30/24 1605    Myriam Dorn BROCKS, [place] 03/30/24 1831    Myriam Dorn BROCKS, [place] 03/30/24 1854    Randol Simmonds, MD 03/31/24 1420

## 2024-03-30 NOTE — ED Notes (Signed)
 Writer retook blood pressure to ensure accuracy

## 2024-03-30 NOTE — Group Note (Deleted)
 Date:  03/30/2024 Time:  2:22 PM  Group Topic/Focus:  Wellness Toolbox:   The focus of this group is to discuss various aspects of wellness, balancing those aspects and exploring ways to increase the ability to experience wellness.  Patients will create a wellness toolbox for use upon discharge.     Participation Level:  {BHH PARTICIPATION OZCZO:77735}  Participation Quality:  {BHH PARTICIPATION QUALITY:22265}  Affect:  {BHH AFFECT:22266}  Cognitive:  {BHH COGNITIVE:22267}  Insight: {BHH Insight2:20797}  Engagement in Group:  {BHH ENGAGEMENT IN HMNLE:77731}  Modes of Intervention:  {BHH MODES OF INTERVENTION:22269}  Additional Comments:  ***  Yesenia Howard 03/30/2024, 2:22 PM

## 2024-03-30 NOTE — Discharge Instructions (Addendum)
 Your workup today here is reassuring did not show any signs of infection or any more serious causes of your joint pain.  Would highly recommend that you follow-up with your primary care regarding continuing management of your pain and possibly further imaging.  Continue taking the prescribed medications that should help manage your pain until you are able to seek reassessment.  Of course, if you begin having worsening pain despite this medication, please come back to the emergency room for further assessment.
